# Patient Record
Sex: Female | Born: 1975 | Race: Black or African American | Hispanic: No | Marital: Single | Smoking: Never smoker
Health system: Southern US, Community
[De-identification: ages and names within clinical notes are randomized; demographics above are authoritative.]

## PROBLEM LIST (undated history)

## (undated) DIAGNOSIS — G43909 Migraine, unspecified, not intractable, without status migrainosus: Secondary | ICD-10-CM

## (undated) DIAGNOSIS — N946 Dysmenorrhea, unspecified: Secondary | ICD-10-CM

## (undated) DIAGNOSIS — I2699 Other pulmonary embolism without acute cor pulmonale: Secondary | ICD-10-CM

## (undated) DIAGNOSIS — I10 Essential (primary) hypertension: Secondary | ICD-10-CM

## (undated) DIAGNOSIS — E119 Type 2 diabetes mellitus without complications: Secondary | ICD-10-CM

## (undated) DIAGNOSIS — Z9889 Other specified postprocedural states: Secondary | ICD-10-CM

## (undated) DIAGNOSIS — Z8742 Personal history of other diseases of the female genital tract: Secondary | ICD-10-CM

## (undated) HISTORY — DX: Other specified postprocedural states: Z98.890

## (undated) HISTORY — DX: Other specified postprocedural states: Z87.42

## (undated) HISTORY — PX: CHOLECYSTECTOMY: SHX55

## (undated) HISTORY — DX: Other pulmonary embolism without acute cor pulmonale: I26.99

## (undated) HISTORY — DX: Dysmenorrhea, unspecified: N94.6

## (undated) HISTORY — PX: CERVICAL BIOPSY  W/ LOOP ELECTRODE EXCISION: SUR135

---

## 2018-02-10 ENCOUNTER — Ambulatory Visit
Admission: EM | Admit: 2018-02-10 | Discharge: 2018-02-10 | Disposition: A | Discharge status: Home / Self Care | Payer: Federal, State, Local not specified - PPO | Attending: Family Medicine | Admitting: Family Medicine

## 2018-02-10 ENCOUNTER — Other Ambulatory Visit: Payer: Self-pay

## 2018-02-10 DIAGNOSIS — B356 Tinea cruris: Secondary | ICD-10-CM | POA: Diagnosis not present

## 2018-02-10 DIAGNOSIS — B373 Candidiasis of vulva and vagina: Secondary | ICD-10-CM | POA: Diagnosis not present

## 2018-02-10 DIAGNOSIS — B3731 Acute candidiasis of vulva and vagina: Secondary | ICD-10-CM

## 2018-02-10 DIAGNOSIS — Z113 Encounter for screening for infections with a predominantly sexual mode of transmission: Secondary | ICD-10-CM

## 2018-02-10 HISTORY — DX: Morbid (severe) obesity due to excess calories: E66.01

## 2018-02-10 HISTORY — DX: Essential (primary) hypertension: I10

## 2018-02-10 HISTORY — DX: Type 2 diabetes mellitus without complications: E11.9

## 2018-02-10 HISTORY — DX: Migraine, unspecified, not intractable, without status migrainosus: G43.909

## 2018-02-10 LAB — WET PREP, GENITAL
CLUE CELLS WET PREP: NONE SEEN
Sperm: NONE SEEN
Trich, Wet Prep: NONE SEEN

## 2018-02-10 LAB — URINALYSIS, COMPLETE (UACMP) WITH MICROSCOPIC
BACTERIA UA: NONE SEEN
BILIRUBIN URINE: NEGATIVE
Glucose, UA: 500 mg/dL — AB
Hgb urine dipstick: NEGATIVE
Ketones, ur: NEGATIVE mg/dL
LEUKOCYTES UA: NEGATIVE
Nitrite: NEGATIVE
PH: 5 (ref 5.0–8.0)
Protein, ur: NEGATIVE mg/dL
RBC / HPF: NONE SEEN RBC/hpf (ref 0–5)
SPECIFIC GRAVITY, URINE: 1.01 (ref 1.005–1.030)

## 2018-02-10 LAB — CHLAMYDIA/NGC RT PCR (ARMC ONLY)
CHLAMYDIA TR: NOT DETECTED
N GONORRHOEAE: NOT DETECTED

## 2018-02-10 MED ORDER — FLUCONAZOLE 150 MG PO TABS: 150.0000 mg | ORAL_TABLET | ORAL | 0 refills | Status: DC

## 2018-02-10 MED ORDER — CLOTRIMAZOLE 1 % EX CREA: TOPICAL_CREAM | CUTANEOUS | 1 refills | Status: DC

## 2018-02-10 NOTE — ED Provider Notes (Signed)
[facility]    CSN: [id number] Arrival date & time: 02/10/18  1346  History   Chief Complaint Chief Complaint  Patient presents with  . Skin Problem   HPI  42 year old female presents with a GYN complaint.  Patient reports that since 4/10 she has noticed "flaking" of the skin in her groin and around the labia.  She states it is itchy.  Patient denies vaginal discharge or vaginal odor.  She states she is taking a dose of Diflucan without resolution.  Patient states that she is monogamous.  However, she would like STD testing today as she has not yet seen a GYN in our area.  No other associated symptoms.  No known exacerbating factors.  No other associated symptoms.  No other complaints.  Past Medical History:  Diagnosis Date  . Diabetes mellitus without complication (HCC)   . Hypertension   . Migraines   . Morbid obesity (HCC)     Past Surgical History:  Procedure Laterality Date  . CHOLECYSTECTOMY    . LEEP     OB History   None    Home Medications    Prior to Admission medications   Medication Sig Start Date End Date Taking? Authorizing Provider  Butalbital-APAP-Caffeine 50-325-40 MG capsule Take by mouth. 10/24/17  Yes [provider]  glimepiride (AMARYL) 1 MG tablet Take by mouth. 04/24/17 04/24/18 Yes [provider]  hydrochlorothiazide (HYDRODIURIL) 25 MG tablet Take by mouth. 11/21/17 11/21/18 Yes [provider]  metFORMIN (GLUCOPHAGE-XR) 500 MG 24 hr tablet Take by mouth. 04/24/17 04/24/18 Yes [provider]  clotrimazole (LOTRIMIN) 1 % cream Apply to affected area 2 times daily for up to 4 weeks. 02/10/18   Tommie Sams, DO  fluconazole (DIFLUCAN) 150 MG tablet Take 1 tablet (150 mg total) by mouth once a week. 02/10/18   Tommie Sams, DO  fluticasone (FLONASE) 50 MCG/ACT nasal spray SHAKE LQ AND U 2 SPRAYS IEN Q NIGHT 12/22/17   [provider]    Family History Family History  Problem Relation Age of Onset  .  Hypertension Mother   . Hypertension Father     Social History Social History   Tobacco Use  . Smoking status: Never Smoker  . Smokeless tobacco: Never Used  Substance Use Topics  . Alcohol use: Not Currently  . Drug use: Not Currently     Allergies   Cat hair extract; Pollen extract; Neomycin; and Tretinoin   Review of Systems Review of Systems  Constitutional: Negative.   Genitourinary: Negative for vaginal discharge.       Itching and "flaking of the skin".   Physical Exam Triage Vital Signs ED Triage Vitals  Enc Vitals Group     BP 02/10/18 1415 (!) 150/110     Pulse --      Resp 02/10/18 1415 18     Temp 02/10/18 1415 98.1 F (36.7 C)     Temp Source 02/10/18 1415 Oral     SpO2 02/10/18 1415 99 %     Weight 02/10/18 1416 (!) 443 lb (200.9 kg)     Height --      Head Circumference --      Peak Flow --      Pain Score 02/10/18 1416 4     Pain Loc --      Pain Edu? --      Excl. in GC? --    Updated Vital Signs BP (!) 150/110 (BP Location: Left Arm)  Temp 98.1 F (36.7 C) (Oral)   Resp 18   Wt (!) 443 lb (200.9 kg)   LMP 01/04/2018   SpO2 99%   Physical Exam  Constitutional: She is oriented to person, place, and time. She appears well-developed. No distress.  Cardiovascular: Normal rate and regular rhythm.  Pulmonary/Chest: Effort normal and breath sounds normal. She has no wheezes. She has no rales.  Neurological: She is alert and oriented to person, place, and time.  Psychiatric: She has a normal mood and affect. Her behavior is normal.  Nursing note and vitals reviewed.  UC Treatments / Results  Labs (all labs ordered are listed, but only abnormal results are displayed) Labs Reviewed  WET PREP, GENITAL - Abnormal; Notable for the following components:      Result Value   Yeast Wet Prep HPF POC PRESENT (*)    WBC, Wet Prep HPF POC MANY (*)    All other components within normal limits  URINALYSIS, COMPLETE (UACMP) WITH MICROSCOPIC -  Abnormal; Notable for the following components:   Color, Urine STRAW (*)    Glucose, UA 500 (*)    All other components within normal limits  CHLAMYDIA/NGC RT PCR ([facility] ONLY)  RPR  HIV ANTIBODY (ROUTINE TESTING)   EKG None   Radiology No results found.  Procedures Procedures (including critical care time)  Medications Ordered in UC Medications - No data to display  Initial Impression / Assessment and Plan / UC Course  I have reviewed the triage vital signs and the nursing notes.  Pertinent labs & imaging results that were available during my care of the patient were reviewed by me and considered in my medical decision making (see chart for details).    42 year old female presents with GYN concerns.  Her exam is consistent with tinea cruris and yeast vaginitis.  Treating with Diflucan and clotrimazole.  Patient desired STD screening and these tests were sent.  We will call patient with results.  Patient also requested urinalysis which was remarkable for glucose but was otherwise negative.  Final Clinical Impressions(s) / UC Diagnoses   Final diagnoses:  Tinea cruris  Yeast vaginitis   ED Discharge Orders        Ordered    fluconazole (DIFLUCAN) 150 MG tablet  Weekly     02/10/18 1451    clotrimazole (LOTRIMIN) 1 % cream     02/10/18 1451     Controlled Substance Prescriptions [place] Controlled Substance Registry consulted? Not Applicable   Tommie Sams, DO 02/10/18 1508

## 2018-02-10 NOTE — Discharge Instructions (Signed)
Meds as prescribed. ° °Take care ° °Dr. Tanisia Yokley  °

## 2018-02-10 NOTE — ED Triage Notes (Signed)
Pt with peeling of skin around her vagina x past 10 days. No discharge from vagina or odor. Took a Diflucan x 1 with no improvement. Pt reports the peeling has been spreading to her upper thighs.

## 2018-02-12 NOTE — Progress Notes (Signed)
Results are within normal range. Pt contacted and made aware. Verbalized understanding.   

## 2018-02-13 LAB — RPR: RPR: NONREACTIVE

## 2018-02-13 LAB — HIV ANTIBODY (ROUTINE TESTING W REFLEX): HIV Screen 4th Generation wRfx: NONREACTIVE

## 2018-03-23 ENCOUNTER — Encounter: Payer: Self-pay | Admitting: Certified Nurse Midwife

## 2018-03-23 ENCOUNTER — Encounter: Payer: Self-pay | Admitting: Obstetrics and Gynecology

## 2018-03-23 ENCOUNTER — Ambulatory Visit: Payer: Federal, State, Local not specified - PPO | Admitting: Obstetrics and Gynecology

## 2018-03-23 VITALS — BP 126/82 | HR 90 | Ht 68.25 in | Wt >= 6400 oz

## 2018-03-23 DIAGNOSIS — R300 Dysuria: Secondary | ICD-10-CM

## 2018-03-23 DIAGNOSIS — B373 Candidiasis of vulva and vagina: Secondary | ICD-10-CM

## 2018-03-23 DIAGNOSIS — B3731 Acute candidiasis of vulva and vagina: Secondary | ICD-10-CM

## 2018-03-23 LAB — POCT URINALYSIS DIPSTICK
BILIRUBIN UA: NEGATIVE
GLUCOSE UA: POSITIVE — AB
Leukocytes, UA: NEGATIVE
Nitrite, UA: NEGATIVE
Protein, UA: NEGATIVE
Spec Grav, UA: 1.015 (ref 1.010–1.025)
Urobilinogen, UA: 0.2 E.U./dL
pH, UA: 5 (ref 5.0–8.0)

## 2018-03-23 MED ORDER — TERCONAZOLE 0.4 % VA CREA: 1.0000 | TOPICAL_CREAM | Freq: Every day | VAGINAL | 3 refills | Status: DC

## 2018-03-23 MED ORDER — FLUCONAZOLE 150 MG PO TABS: 150.0000 mg | ORAL_TABLET | Freq: Once | ORAL | 3 refills | Status: AC

## 2018-03-23 NOTE — Progress Notes (Signed)
   Patient ID: Erika Rose, female   DOB: 05/26/1976, 42 y.o.   MRN: [id number]  Reason for Consult: Vaginitis (NP yeast infection)   Referred by Margart SicklesUthe, William, MD  Subjective:     HPI:  Erika Rose is a 42 y.o. female . She reports vaginal itching  Past Medical History:  Diagnosis Date  . Diabetes mellitus without complication (HCC)   . Dysmenorrhea   . History of cervical polypectomy   . Hypertension   . Migraines   . Morbid obesity (HCC)   . Pulmonary embolism (HCC)    secondary to estrogen birth control   Family History  Problem Relation Age of Onset  . Hypertension Mother   . Hypertension Father    Past Surgical History:  Procedure Laterality Date  . CHOLECYSTECTOMY    . LEEP      Short Social History:  Social History   Tobacco Use  . Smoking status: Never Smoker  . Smokeless tobacco: Never Used  Substance Use Topics  . Alcohol use: Not Currently    Allergies  Allergen Reactions  . Cat Hair Extract Itching  . Plum Pulp Other (See Comments)    hypoxia  . Pollen Extract Swelling  . Neomycin Rash and Dermatitis    Rash, redness  . Tretinoin Rash and Dermatitis  . Apple   . Cherry   . Tree Extract     Tree allergies '    Current Outpatient Medications  Medication Sig Dispense Refill  . Butalbital-APAP-Caffeine 50-325-40 MG capsule Take by mouth.    Marland Kitchen. glimepiride (AMARYL) 1 MG tablet Take by mouth.    . hydrochlorothiazide (HYDRODIURIL) 25 MG tablet Take by mouth.    . metFORMIN (GLUCOPHAGE-XR) 500 MG 24 hr tablet Take by mouth.    . Multiple Vitamin (MULTIVITAMIN) tablet Take 1 tablet by mouth daily.    . clotrimazole (LOTRIMIN) 1 % cream Apply to affected area 2 times daily for up to 4 weeks. (Patient not taking: Reported on 03/23/2018) 113 g 1   No current facility-administered medications for this visit.     REVIEW OF SYSTEMS      Objective:  Objective   Vitals:   03/23/18 1443  BP: 126/82  Pulse: 90  Weight: (!) 435 lb (197.3 kg)   Height: 5' 8.25" (1.734 m)   Body mass index is 65.66 kg/m.  Physical Exam     Assessment/Plan:     Yeast vaginitis High dose antifungal Weekly diflucan until resolution  Nuswab sent today  Discussed that patient needs to address her diabetes before she will see a resolution of these symptoms.   Adelene Idlerhristanna Schuman MD [facility], [facility] 03/23/18 3:45 PM

## 2018-03-26 ENCOUNTER — Encounter: Payer: Self-pay | Admitting: Certified Nurse Midwife

## 2018-04-04 LAB — NUSWAB BV AND CANDIDA, NAA
CANDIDA ALBICANS, NAA: POSITIVE — AB
Candida glabrata, NAA: NEGATIVE

## 2018-04-04 NOTE — Progress Notes (Signed)
Please call patient with result or yeast infection and see how she is feeling, she was sent home with prescriptions.  Thank you,  Dr. Jerene PitchSchuman

## 2018-06-19 ENCOUNTER — Encounter: Payer: Self-pay | Admitting: Obstetrics and Gynecology

## 2018-06-19 ENCOUNTER — Other Ambulatory Visit (HOSPITAL_COMMUNITY)
Admission: RE | Admit: 2018-06-19 | Discharge: 2018-06-19 | Disposition: A | Discharge status: Home / Self Care | Payer: Federal, State, Local not specified - PPO | Source: Ambulatory Visit | Attending: Obstetrics and Gynecology | Admitting: Obstetrics and Gynecology

## 2018-06-19 ENCOUNTER — Ambulatory Visit (INDEPENDENT_AMBULATORY_CARE_PROVIDER_SITE_OTHER): Payer: Federal, State, Local not specified - PPO | Admitting: Obstetrics and Gynecology

## 2018-06-19 VITALS — BP 130/84 | HR 86 | Ht 68.0 in | Wt >= 6400 oz

## 2018-06-19 DIAGNOSIS — G43909 Migraine, unspecified, not intractable, without status migrainosus: Secondary | ICD-10-CM | POA: Insufficient documentation

## 2018-06-19 DIAGNOSIS — E119 Type 2 diabetes mellitus without complications: Secondary | ICD-10-CM | POA: Insufficient documentation

## 2018-06-19 DIAGNOSIS — Z01411 Encounter for gynecological examination (general) (routine) with abnormal findings: Secondary | ICD-10-CM

## 2018-06-19 DIAGNOSIS — Z124 Encounter for screening for malignant neoplasm of cervix: Secondary | ICD-10-CM

## 2018-06-19 DIAGNOSIS — Z6841 Body Mass Index (BMI) 40.0 and over, adult: Secondary | ICD-10-CM | POA: Insufficient documentation

## 2018-06-19 DIAGNOSIS — Z113 Encounter for screening for infections with a predominantly sexual mode of transmission: Secondary | ICD-10-CM

## 2018-06-19 DIAGNOSIS — Z1231 Encounter for screening mammogram for malignant neoplasm of breast: Secondary | ICD-10-CM | POA: Diagnosis not present

## 2018-06-19 DIAGNOSIS — I10 Essential (primary) hypertension: Secondary | ICD-10-CM | POA: Insufficient documentation

## 2018-06-19 DIAGNOSIS — Z1239 Encounter for other screening for malignant neoplasm of breast: Secondary | ICD-10-CM

## 2018-06-19 DIAGNOSIS — Z01419 Encounter for gynecological examination (general) (routine) without abnormal findings: Secondary | ICD-10-CM

## 2018-06-19 MED ORDER — MEDROXYPROGESTERONE ACETATE 10 MG PO TABS: 10.0000 mg | ORAL_TABLET | Freq: Every day | ORAL | 2 refills | Status: DC

## 2018-06-19 NOTE — Patient Instructions (Signed)
[facility] [address] [place] [address]  [facility] [place]  [address] [place] [address]  Phone: [phone]  

## 2018-06-19 NOTE — Progress Notes (Signed)
Gynecology Annual Exam  PCP: Chalmers Cater, MD  Chief Complaint:  Chief Complaint  Patient presents with  . Gynecologic Exam    History of Present Illness: Patient is a 42 y.o. G0P0000 presents for annual exam. The patient has no complaints today.   LMP: Patient's last menstrual period was 05/22/2018 (approximate). Average Interval: regular, 30 days Duration of flow: 5 days Heavy Menses: no Clots: no Intermenstrual Bleeding: no Postcoital Bleeding: no Dysmenorrhea: no  The patient has a long standing history of menorrhagia.  Did try Mirena but was not happy with cycle control and discontinued.  Was managed with cyclical provera every 90 days if amenorrhea but has not had to use this.    The patient is sexually active. She currently uses none for contraception. She denies dyspareunia.  The patient does perform self breast exams.  There is no notable family history of breast or ovarian cancer in her family.  The patient wears seatbelts: yes.   The patient has regular exercise: not asked.    The patient denies current symptoms of depression.    Review of Systems: ROS  Past Medical History:  Past Medical History:  Diagnosis Date  . Diabetes mellitus without complication ([place])   . Dysmenorrhea   . History of cervical polypectomy   . Hypertension   . Migraines   . Morbid obesity ([place])   . Pulmonary embolism ([place])    secondary to estrogen birth control    Past Surgical History:  Past Surgical History:  Procedure Laterality Date  . CERVICAL BIOPSY  W/ LOOP ELECTRODE EXCISION    . CHOLECYSTECTOMY      Gynecologic History:  Patient's last menstrual period was 05/22/2018 (approximate). Contraception: none  Ultrasound 09/26/2016 secondary to menorrhagia at [facility] showing 0.7cm heterogenous endometrial stripe, negative for fibroids Endometrial biopsy 08/26/2016 secretory endometrium Pap 08/26/2016 NIL HPV negative  Obstetric History: G0P0000  Family History:  Family  History  Problem Relation Age of Onset  . Hypertension Mother   . Hypertension Father     Social History:  Social History   Socioeconomic History  . Marital status: Single    Spouse name: Not on file  . Number of children: Not on file  . Years of education: Not on file  . Highest education level: Not on file  Occupational History  . Not on file  Social Needs  . Financial resource strain: Not on file  . Food insecurity:    Worry: Not on file    Inability: Not on file  . Transportation needs:    Medical: Not on file    Non-medical: Not on file  Tobacco Use  . Smoking status: Never Smoker  . Smokeless tobacco: Never Used  Substance and Sexual Activity  . Alcohol use: Not Currently  . Drug use: Not Currently  . Sexual activity: Yes    Birth control/protection: None  Lifestyle  . Physical activity:    Days per week: 0 days    Minutes per session: 0 min  . Stress: Only a little  Relationships  . Social connections:    Talks on phone: Not on file    Gets together: Not on file    Attends religious service: Not on file    Active member of club or organization: Not on file    Attends meetings of clubs or organizations: Not on file    Relationship status: Not on file  . Intimate partner violence:    Fear of current or ex  partner: Not on file    Emotionally abused: Not on file    Physically abused: Not on file    Forced sexual activity: Not on file  Other Topics Concern  . Not on file  Social History Narrative  . Not on file    Allergies:  Allergies  Allergen Reactions  . Cat Hair Extract Itching  . Plum Pulp Other (See Comments)    hypoxia  . Pollen Extract Swelling  . Neomycin Rash and Dermatitis    Rash, redness  . Tretinoin Rash and Dermatitis  . Apple   . [place]   . Tree Extract     Tree allergies '    Medications: Prior to Admission medications   Medication Sig Start Date End Date Taking? Authorizing Provider  Blood Glucose Monitoring Suppl  (FIFTY50 GLUCOSE METER 2.0) w/Device KIT Use 1 each as directed 06/19/18  Yes [provider]  Butalbital-APAP-Caffeine 50-325-40 MG capsule Take by mouth. 10/24/17  Yes [provider]  glimepiride (AMARYL) 2 MG tablet Take by mouth. 06/19/18 06/14/19 Yes [provider]  glucose blood (PRECISION QID TEST) test strip Use 1 each (1 strip total) 2 (two) times daily Use as instructed. 06/19/18 06/19/19 Yes [provider]  hydrochlorothiazide (HYDRODIURIL) 25 MG tablet Take by mouth. 11/21/17 11/21/18 Yes [provider]  Lancets Misc. (UNISTIK 2 NORMAL) MISC Use 1 each 2 (two) times daily Use as instructed. 06/19/18 06/19/19 Yes [provider]  Multiple Vitamin (MULTIVITAMIN) tablet Take 1 tablet by mouth daily.   Yes [provider]  clotrimazole (LOTRIMIN) 1 % cream Apply to affected area 2 times daily for up to 4 weeks. Patient not taking: Reported on 03/23/2018 02/10/18   Coral Spikes, DO  metFORMIN (GLUCOPHAGE-XR) 500 MG 24 hr tablet Take by mouth. 04/24/17 04/24/18  [provider]  terconazole (TERAZOL 7) 0.4 % vaginal cream Place 1 applicator vaginally at bedtime. Patient not taking: Reported on 06/19/2018 03/23/18   Homero Fellers, MD    Physical Exam Vitals: Blood pressure 130/84, pulse 86, height _0  (1.727 m), weight (!) 415 lb 14.4 oz (188.7 kg), last menstrual period 05/22/2018. Body mass index is 63.24 kg/m.   General: NAD HEENT: normocephalic, anicteric Thyroid: no enlargement, no palpable nodules Pulmonary: No increased work of breathing, CTAB Cardiovascular: RRR, distal pulses 2+ Breast: Breast symmetrical, no tenderness, no palpable nodules or masses, no skin or nipple retraction present, no nipple discharge.  No axillary or supraclavicular lymphadenopathy. Abdomen: NABS, soft, non-tender, non-distended.  Umbilicus without lesions.  No hepatomegaly, splenomegaly or masses palpable. No evidence of hernia   Genitourinary:  External: Normal external female genitalia.  Normal urethral meatus, normal Bartholin's and Skene's glands.    Vagina: Normal vaginal mucosa, no evidence of prolapse.    Cervix: Grossly normal in appearance, no bleeding  Uterus: Non-enlarged, mobile, normal contour.  No CMT  Adnexa: ovaries non-enlarged, no adnexal masses  Rectal: deferred  Lymphatic: no evidence of inguinal lymphadenopathy Extremities: no edema, erythema, or tenderness Neurologic: Grossly intact Psychiatric: mood appropriate, affect full  Female chaperone present for pelvic and breast  portions of the physical exam    Assessment: 42 y.o. G0P0000 routine annual exam  Plan: Problem List Items Addressed This Visit    None    Visit Diagnoses    Screening for malignant neoplasm of cervix    -  Primary   Relevant Orders   Cytology - PAP   Encounter for gynecological examination without abnormal finding  Breast screening       Relevant Orders   MM 3D SCREEN BREAST BILATERAL   Routine screening for STI (sexually transmitted infection)       Relevant Orders   Cytology - PAP   HEP, RPR, HIV Panel (Completed)   Class 3 severe obesity due to excess calories with serious comorbidity and body mass index (BMI) of 60.0 to 69.9 in adult [facility])       Relevant Medications   glimepiride (AMARYL) 2 MG tablet   Other Relevant Orders   Amb Referral to Bariatric Surgery      1) Mammogram - recommend yearly screening mammogram.  Mammogram Was ordered today   2) STI screening  wasoffered and accepted  3) ASCCP guidelines and rational discussed.  Patient opts for every 3 years screening interval  4) Contraception - the patient is currently using  none. She does inquire about pregnancy odd at her age and given her co-morbidities.  I told her in the setting of regular monthly menses she is likely ovulatory by history.  However her BMI and co-morbidities make her high risk.  I discussed exploring bariatric  surgery option as both a means of potentially aiding with conception, decreasing risk of pregnancy, and well as her long term overall health.    5) Colonoscopy -- discussed obtaining at age 40 given higher incidence of colon cancer in African American patients  6) Routine healthcare maintenance including cholesterol, diabetes screening discussed managed by PCP  7) Return in about 1 year (around 06/20/2019) for annual.   Malachy Mood, MD, [place], [facility] 06/19/2018, 3:19 PM

## 2018-06-20 LAB — HEP, RPR, HIV PANEL
HIV Screen 4th Generation wRfx: NONREACTIVE
Hepatitis B Surface Ag: NEGATIVE
RPR Ser Ql: NONREACTIVE

## 2018-06-22 LAB — CYTOLOGY - PAP
Adequacy: ABSENT
Chlamydia: NEGATIVE
Diagnosis: NEGATIVE
HPV: NOT DETECTED
Neisseria Gonorrhea: NEGATIVE

## 2018-06-23 ENCOUNTER — Other Ambulatory Visit: Payer: Self-pay | Admitting: Obstetrics and Gynecology

## 2018-06-25 NOTE — Telephone Encounter (Signed)
Please advise. Pt requests 90 day supply

## 2018-10-27 ENCOUNTER — Encounter: Payer: Self-pay | Admitting: Emergency Medicine

## 2018-10-27 DIAGNOSIS — E119 Type 2 diabetes mellitus without complications: Secondary | ICD-10-CM | POA: Diagnosis not present

## 2018-10-27 DIAGNOSIS — I10 Essential (primary) hypertension: Secondary | ICD-10-CM | POA: Diagnosis not present

## 2018-10-27 DIAGNOSIS — K8050 Calculus of bile duct without cholangitis or cholecystitis without obstruction: Secondary | ICD-10-CM | POA: Diagnosis not present

## 2018-10-27 DIAGNOSIS — Z79899 Other long term (current) drug therapy: Secondary | ICD-10-CM | POA: Insufficient documentation

## 2018-10-27 DIAGNOSIS — R1010 Upper abdominal pain, unspecified: Secondary | ICD-10-CM | POA: Diagnosis present

## 2018-10-27 LAB — CBC
HCT: 40.9 % (ref 36.0–46.0)
Hemoglobin: 13.5 g/dL (ref 12.0–15.0)
MCH: 28.8 pg (ref 26.0–34.0)
MCHC: 33 g/dL (ref 30.0–36.0)
MCV: 87.2 fL (ref 80.0–100.0)
NRBC: 0 % (ref 0.0–0.2)
PLATELETS: 294 10*3/uL (ref 150–400)
RBC: 4.69 MIL/uL (ref 3.87–5.11)
RDW: 12.7 % (ref 11.5–15.5)
WBC: 7 10*3/uL (ref 4.0–10.5)

## 2018-10-27 LAB — COMPREHENSIVE METABOLIC PANEL
ALBUMIN: 3.9 g/dL (ref 3.5–5.0)
ALK PHOS: 184 U/L — AB (ref 38–126)
ALT: 167 U/L — ABNORMAL HIGH (ref 0–44)
ANION GAP: 9 (ref 5–15)
AST: 342 U/L — ABNORMAL HIGH (ref 15–41)
BILIRUBIN TOTAL: 1.5 mg/dL — AB (ref 0.3–1.2)
BUN: 11 mg/dL (ref 6–20)
CALCIUM: 9.6 mg/dL (ref 8.9–10.3)
CO2: 29 mmol/L (ref 22–32)
Chloride: 99 mmol/L (ref 98–111)
Creatinine, Ser: 0.68 mg/dL (ref 0.44–1.00)
GFR calc non Af Amer: >60 mL/min (ref >60)
Glucose, Bld: 142 mg/dL — ABNORMAL HIGH (ref 70–99)
Potassium: 3.9 mmol/L (ref 3.5–5.1)
Sodium: 137 mmol/L (ref 135–145)
TOTAL PROTEIN: 8.2 g/dL — AB (ref 6.5–8.1)

## 2018-10-27 LAB — URINALYSIS, COMPLETE (UACMP) WITH MICROSCOPIC
Bilirubin Urine: NEGATIVE
GLUCOSE, UA: NEGATIVE mg/dL
HGB URINE DIPSTICK: NEGATIVE
KETONES UR: NEGATIVE mg/dL
LEUKOCYTES UA: NEGATIVE
NITRITE: NEGATIVE
PH: 7 (ref 5.0–8.0)
Protein, ur: NEGATIVE mg/dL
Specific Gravity, Urine: 1.006 (ref 1.005–1.030)

## 2018-10-27 LAB — LIPASE, BLOOD: Lipase: 25 U/L (ref 11–51)

## 2018-10-27 LAB — POCT PREGNANCY, URINE: Preg Test, Ur: NEGATIVE

## 2018-10-27 NOTE — ED Notes (Signed)
Patient refused to have vital signs rechecked. 

## 2018-10-27 NOTE — ED Notes (Signed)
Patient up to stat desk asking about patient relations.  Patient given card with information for patient experience.

## 2018-10-27 NOTE — ED Triage Notes (Signed)
Patient with complaint of generalized abdominal pain and nausea that started about noon today. Patient states that the pain is worse in the left upper quad. Patient states that she had a BM today.

## 2018-10-28 ENCOUNTER — Emergency Department
Admission: EM | Admit: 2018-10-28 | Discharge: 2018-10-28 | Disposition: A | Discharge status: Home / Self Care | Payer: Federal, State, Local not specified - PPO | Attending: Emergency Medicine | Admitting: Emergency Medicine

## 2018-10-28 ENCOUNTER — Emergency Department: Payer: Federal, State, Local not specified - PPO

## 2018-10-28 ENCOUNTER — Encounter: Payer: Self-pay | Admitting: Radiology

## 2018-10-28 DIAGNOSIS — K8050 Calculus of bile duct without cholangitis or cholecystitis without obstruction: Secondary | ICD-10-CM

## 2018-10-28 DIAGNOSIS — R748 Abnormal levels of other serum enzymes: Secondary | ICD-10-CM

## 2018-10-28 DIAGNOSIS — R1010 Upper abdominal pain, unspecified: Secondary | ICD-10-CM

## 2018-10-28 LAB — BILIRUBIN, DIRECT: Bilirubin, Direct: 0.4 mg/dL — ABNORMAL HIGH (ref 0.0–0.2)

## 2018-10-28 MED ORDER — MORPHINE SULFATE (PF) 4 MG/ML IV SOLN
4.0000 mg | Freq: Once | INTRAVENOUS | Status: AC
  Administered 2018-10-28: 4 mg via INTRAVENOUS
  Filled 2018-10-28: qty 1

## 2018-10-28 MED ORDER — SODIUM CHLORIDE 0.9 % IV BOLUS
1000.0000 mL | Freq: Once | INTRAVENOUS | Status: AC
  Administered 2018-10-28: 1000 mL via INTRAVENOUS

## 2018-10-28 MED ORDER — ONDANSETRON HCL 4 MG/2ML IJ SOLN
4.0000 mg | Freq: Once | INTRAMUSCULAR | Status: AC
  Administered 2018-10-28: 4 mg via INTRAVENOUS
  Filled 2018-10-28: qty 2

## 2018-10-28 MED ORDER — TRAMADOL HCL 50 MG PO TABS: 50.0000 mg | ORAL_TABLET | Freq: Four times a day (QID) | ORAL | 0 refills | Status: AC | PRN

## 2018-10-28 MED ORDER — ONDANSETRON 8 MG PO TBDP: 8.0000 mg | ORAL_TABLET | Freq: Three times a day (TID) | ORAL | 0 refills | Status: AC | PRN

## 2018-10-28 MED ORDER — IOPAMIDOL (ISOVUE-370) INJECTION 76%
125.0000 mL | Freq: Once | INTRAVENOUS | Status: AC | PRN
  Administered 2018-10-28: 125 mL via INTRAVENOUS

## 2018-10-28 NOTE — ED Provider Notes (Signed)
[facility] Emergency Department Provider Note ____________________________________________   First MD Initiated Contact with Patient 10/28/18 0024     (approximate)  I have reviewed the triage vital signs and the nursing notes.   HISTORY  Chief Complaint Abdominal Pain    HPI Erika Rose is a 43 y.o. female with PMH as noted below who presents with upper abdominal pain over the last several hours, associated with nausea but no vomiting, and initially in her mid abdomen but then radiating up to her epigastric region and left upper quadrant.  The patient states it feels similar to pain she had before she had her gallbladder taken out.  She denies any change in her bowel movements.  She has no fever or urinary symptoms.  The pain was not associated with eating.  She states she last ate a small amount around 8 AM and the pain started about noon.  Past Medical History:  Diagnosis Date  . Diabetes mellitus without complication ([place])   . Dysmenorrhea   . History of cervical polypectomy   . Hypertension   . Migraines   . Morbid obesity ([place])   . Pulmonary embolism ([place])    secondary to estrogen birth control    There are no active problems to display for this patient.   Past Surgical History:  Procedure Laterality Date  . CERVICAL BIOPSY  W/ LOOP ELECTRODE EXCISION    . CHOLECYSTECTOMY      Prior to Admission medications   Medication Sig Start Date End Date Taking? Authorizing Provider  Blood Glucose Monitoring Suppl (FIFTY50 GLUCOSE METER 2.0) w/Device KIT Use 1 each as directed 06/19/18   [provider]  Butalbital-APAP-Caffeine 50-325-40 MG capsule Take by mouth. 10/24/17   [provider]  clotrimazole (LOTRIMIN) 1 % cream Apply to affected area 2 times daily for up to 4 weeks. Patient not taking: Reported on 03/23/2018 02/10/18   Coral Spikes, DO  glimepiride (AMARYL) 2 MG tablet Take by mouth. 06/19/18 06/14/19  [provider]  glucose blood (PRECISION QID TEST) test strip Use 1 each (1 strip total) 2 (two) times daily Use as instructed. 06/19/18 06/19/19  [provider]  hydrochlorothiazide (HYDRODIURIL) 25 MG tablet Take by mouth. 11/21/17 11/21/18  [provider]  Lancets Misc. (UNISTIK 2 NORMAL) MISC Use 1 each 2 (two) times daily Use as instructed. 06/19/18 06/19/19  [provider]  medroxyPROGESTERone (PROVERA) 10 MG tablet TAKE 1 TABLET BY MOUTH EVERY DAY FOR 10 DAYS 06/26/18   Malachy Mood, MD  metFORMIN (GLUCOPHAGE-XR) 500 MG 24 hr tablet Take by mouth. 04/24/17 04/24/18  [provider]  Multiple Vitamin (MULTIVITAMIN) tablet Take 1 tablet by mouth daily.    [provider]  ondansetron (ZOFRAN ODT) 8 MG disintegrating tablet Take 1 tablet (8 mg total) by mouth every 8 (eight) hours as needed for up to 5 days for nausea or vomiting. 10/28/18 11/02/18  Arta Silence, MD  terconazole (TERAZOL 7) 0.4 % vaginal cream Place 1 applicator vaginally at bedtime. Patient not taking: Reported on 06/19/2018 03/23/18   Homero Fellers, MD  traMADol (ULTRAM) 50 MG tablet Take 1 tablet (50 mg total) by mouth every 6 (six) hours as needed for up to 5 days. 10/28/18 11/02/18  Arta Silence, MD    Allergies Cat hair extract; Plum pulp; Pollen extract; Neomycin; Tretinoin; Apple; Cherry; and Tree extract  Family History  Problem Relation Age of Onset  . Hypertension Mother   . Hypertension  Father     Social History Social History   Tobacco Use  . Smoking status: Never Smoker  . Smokeless tobacco: Never Used  Substance Use Topics  . Alcohol use: Not Currently  . Drug use: Not Currently    Review of Systems  Constitutional: No fever. Eyes: No redness. ENT: No sore throat. Cardiovascular: Denies chest pain. Respiratory: Denies shortness of breath. Gastrointestinal: Positive for nausea. Genitourinary: Negative for dysuria.  Musculoskeletal: Negative for back  pain. Skin: Negative for rash. Neurological: Negative for headache.   ____________________________________________   PHYSICAL EXAM:  VITAL SIGNS: ED Triage Vitals  Enc Vitals Group     BP 10/27/18 1921 (!) 159/98     Pulse Rate 10/27/18 1921 86     Resp 10/27/18 1921 18     Temp 10/27/18 1921 98.4 F (36.9 C)     Temp Source 10/27/18 1921 Oral     SpO2 10/27/18 1921 100 %     Weight 10/27/18 1918 (!) 403 lb (182.8 kg)     Height 10/27/18 1918 5' 8" (1.727 m)     Head Circumference --      Peak Flow --      Pain Score 10/27/18 1918 7     Pain Loc --      Pain Edu? --      Excl. in [place]? --     Constitutional: Alert and oriented.  Slightly uncomfortable appearing but in no acute distress. Eyes: Conjunctivae are normal.  No scleral icterus. Head: Atraumatic. Nose: No congestion/rhinnorhea. Mouth/Throat: Mucous membranes are moist.   Neck: Normal range of motion.  Cardiovascular: Normal rate, regular rhythm. Grossly normal heart sounds.  Good peripheral circulation. Respiratory: Normal respiratory effort.  No retractions. Lungs CTAB. Gastrointestinal: Soft with mild epigastric and bilateral upper quadrant tenderness. No distention.  Genitourinary: No flank tenderness. Musculoskeletal:  Extremities warm and well perfused.  Neurologic:  Normal speech and language. No gross focal neurologic deficits are appreciated.  Skin:  Skin is warm and dry. No rash noted. Psychiatric: Mood and affect are normal. Speech and behavior are normal.  ____________________________________________   LABS (all labs ordered are listed, but only abnormal results are displayed)  Labs Reviewed  COMPREHENSIVE METABOLIC PANEL - Abnormal; Notable for the following components:      Result Value   Glucose, Bld 142 (*)    Total Protein 8.2 (*)    AST 342 (*)    ALT 167 (*)    Alkaline Phosphatase 184 (*)    Total Bilirubin 1.5 (*)    All other components within normal limits  URINALYSIS, COMPLETE  (UACMP) WITH MICROSCOPIC - Abnormal; Notable for the following components:   Color, Urine YELLOW (*)    APPearance CLEAR (*)    Bacteria, UA RARE (*)    All other components within normal limits  BILIRUBIN, DIRECT - Abnormal; Notable for the following components:   Bilirubin, Direct 0.4 (*)    All other components within normal limits  LIPASE, BLOOD  CBC  POCT PREGNANCY, URINE  POC URINE PREG, ED   ____________________________________________  EKG  ED ECG REPORT I, Arta Silence, the attending physician, personally viewed and interpreted this ECG.  Date: 10/28/2018 EKG Time: 1918 Rate: 85 Rhythm: normal sinus rhythm QRS Axis: normal Intervals: normal ST/T Wave abnormalities: normal Narrative Interpretation: no evidence of acute ischemia  ____________________________________________  RADIOLOGY  CT abdomen: No acute abnormality [place] RUQ: No biliary dilatation or other acute abnormality  ____________________________________________   PROCEDURES  Procedure(s)  performed: No  Procedures  Critical Care performed: No ____________________________________________   INITIAL IMPRESSION / ASSESSMENT AND PLAN / ED COURSE  Pertinent labs & imaging results that were available during my care of the patient were reviewed by me and considered in my medical decision making (see chart for details).  43 year old female with PMH as noted above and status post cholecystectomy several years ago presents with abdominal pain, initially in her mid abdomen and now more in the epigastric region and in bilateral upper quadrants.  It is associated with nausea but no vomiting, and did not directly follow eating food.  On exam the patient is somewhat uncomfortable but overall well-appearing.  Her abdomen is soft with some mild epigastric and upper quadrant tenderness.  The remainder of the exam is as described above.  The lab work-up obtained from triage is significant for elevated LFTs,  alk phos, and total bilirubin but the lipase is negative.  Given that the patient is status post cholecystectomy, the most likely causes would be retained stone, choledocholithiasis, cholangitis, or other hepatobiliary etiology, versus gastritis or PUD.  We will obtain CT abdomen, treat pain and nausea, and reassess.  ----------------------------------------- 2:31 AM on 10/28/2018 -----------------------------------------  CT shows no acute abnormalities.  I consulted Dr. Alice Reichert from gastroenterology and discussed the case with him over the phone.  He recommended obtaining an ultrasound to rule out retained bile duct stone and advised that if this was negative the patient would be appropriate for discharge with outpatient follow-up for further evaluation of her elevated LFTs.  ----------------------------------------- 5:52 AM on 10/28/2018 -----------------------------------------  Ultrasound showed no acute abnormalities other than hepatic steatosis.  The patient continues to be comfortable and has not required any further pain medication.  At this time the patient is stable for discharge home.  She feels comfortable going home.  We waited 4 hours after the last morphine since the patient drove herself here.  Based on the recommendations of Dr. Alice Reichert, I will refer her to GI.  I gave the patient thorough return precautions and she expressed understanding.  ____________________________________________   FINAL CLINICAL IMPRESSION(S) / ED DIAGNOSES  Final diagnoses:  Choledocholithiasis  Upper abdominal pain  Elevated liver enzymes      NEW MEDICATIONS STARTED DURING THIS VISIT:  New Prescriptions   ONDANSETRON (ZOFRAN ODT) 8 MG DISINTEGRATING TABLET    Take 1 tablet (8 mg total) by mouth every 8 (eight) hours as needed for up to 5 days for nausea or vomiting.   TRAMADOL (ULTRAM) 50 MG TABLET    Take 1 tablet (50 mg total) by mouth every 6 (six) hours as needed for up to 5 days.      Note:  This document was prepared using Dragon voice recognition software and may include unintentional dictation errors.    Arta Silence, MD 10/28/18 [phone]

## 2018-10-28 NOTE — ED Notes (Signed)
Patient transported to CT 

## 2018-10-28 NOTE — Discharge Instructions (Addendum)
Your liver enzymes are somewhat elevated.  It is very important that you follow-up with a gastroenterologist as soon as possible.  You should call Dr. Norma Fredrickson or another gastroenterologist of your choice on Monday to schedule for an appointment within the next 1 to 2 weeks.  You can take the Zofran as needed for nausea, and the tramadol as needed for pain.  In addition you should start on an acid blocking medication such as Pepcid since her symptoms also may be due to gastritis.  Return to the ER for new, worsening, persistent severe abdominal pain, vomiting, fever, or any other new or worsening symptoms that concern you.

## 2018-11-15 ENCOUNTER — Other Ambulatory Visit: Payer: Self-pay | Admitting: Obstetrics and Gynecology

## 2018-11-15 ENCOUNTER — Ambulatory Visit
Admission: RE | Admit: 2018-11-15 | Discharge: 2018-11-15 | Disposition: A | Discharge status: Home / Self Care | Payer: Federal, State, Local not specified - PPO | Source: Ambulatory Visit | Attending: Obstetrics and Gynecology | Admitting: Obstetrics and Gynecology

## 2018-11-15 DIAGNOSIS — Z1239 Encounter for other screening for malignant neoplasm of breast: Secondary | ICD-10-CM

## 2018-11-15 DIAGNOSIS — R928 Other abnormal and inconclusive findings on diagnostic imaging of breast: Secondary | ICD-10-CM

## 2018-11-15 DIAGNOSIS — N6310 Unspecified lump in the right breast, unspecified quadrant: Secondary | ICD-10-CM

## 2018-12-03 ENCOUNTER — Ambulatory Visit
Admission: RE | Admit: 2018-12-03 | Discharge: 2018-12-03 | Disposition: A | Discharge status: Home / Self Care | Payer: Federal, State, Local not specified - PPO | Source: Ambulatory Visit | Attending: Obstetrics and Gynecology | Admitting: Obstetrics and Gynecology

## 2018-12-03 DIAGNOSIS — R928 Other abnormal and inconclusive findings on diagnostic imaging of breast: Secondary | ICD-10-CM | POA: Diagnosis present

## 2018-12-03 DIAGNOSIS — N6310 Unspecified lump in the right breast, unspecified quadrant: Secondary | ICD-10-CM | POA: Insufficient documentation

## 2019-02-27 ENCOUNTER — Other Ambulatory Visit: Payer: Self-pay | Admitting: Obstetrics and Gynecology

## 2019-02-27 MED ORDER — FLUCONAZOLE 150 MG PO TABS: 150.0000 mg | ORAL_TABLET | Freq: Once | ORAL | 0 refills | Status: DC

## 2019-05-08 ENCOUNTER — Other Ambulatory Visit: Payer: Self-pay

## 2019-05-08 ENCOUNTER — Ambulatory Visit (INDEPENDENT_AMBULATORY_CARE_PROVIDER_SITE_OTHER): Payer: Federal, State, Local not specified - PPO | Admitting: Obstetrics and Gynecology

## 2019-05-08 ENCOUNTER — Encounter: Payer: Self-pay | Admitting: Obstetrics and Gynecology

## 2019-05-08 VITALS — BP 124/89 | HR 89 | Ht 69.0 in

## 2019-05-08 DIAGNOSIS — N760 Acute vaginitis: Secondary | ICD-10-CM

## 2019-05-08 DIAGNOSIS — Z01419 Encounter for gynecological examination (general) (routine) without abnormal findings: Secondary | ICD-10-CM

## 2019-05-08 DIAGNOSIS — Z131 Encounter for screening for diabetes mellitus: Secondary | ICD-10-CM

## 2019-05-08 DIAGNOSIS — Z1239 Encounter for other screening for malignant neoplasm of breast: Secondary | ICD-10-CM

## 2019-05-08 DIAGNOSIS — Z1329 Encounter for screening for other suspected endocrine disorder: Secondary | ICD-10-CM

## 2019-05-08 DIAGNOSIS — Z13220 Encounter for screening for lipoid disorders: Secondary | ICD-10-CM

## 2019-05-08 NOTE — Progress Notes (Signed)
  Gynecology Annual Exam  PCP: Uthe, William, MD  Chief Complaint:  Chief Complaint  Patient presents with  . Gynecologic Exam  . Vaginitis    History of Present Illness: Patient is a 43 y.o. G0P0000 presents for annual exam. The patient has no complaints today.   LMP: Patient's last menstrual period was 05/01/2019 (exact date). Average Interval: regular, 35 days Duration of flow: 6 days Heavy Menses: no Clots: no Intermenstrual Bleeding: no Postcoital Bleeding: no Dysmenorrhea: no   The patient is sexually active. She currently uses none for contraception. She denies dyspareunia.  The patient does perform self breast exams.  There is no notable family history of breast or ovarian cancer in her family.  The patient wears seatbelts: yes.   The patient has regular exercise: not asked.    The patient denies current symptoms of depression.    Review of Systems: Review of Systems  Constitutional: Negative for chills and fever.  HENT: Negative for congestion.   Respiratory: Negative for cough and shortness of breath.   Cardiovascular: Negative for chest pain and palpitations.  Gastrointestinal: Negative for abdominal pain, constipation, diarrhea, heartburn, nausea and vomiting.  Genitourinary: Negative for dysuria, frequency and urgency.  Skin: Negative for itching and rash.  Neurological: Negative for dizziness and headaches.  Endo/Heme/Allergies: Negative for polydipsia.  Psychiatric/Behavioral: Negative for depression.    Past Medical History:  Past Medical History:  Diagnosis Date  . Diabetes mellitus without complication ([facility])   . Dysmenorrhea   . History of cervical polypectomy   . Hypertension   . Migraines   . Morbid obesity ([facility])   . Pulmonary embolism ([facility])    secondary to estrogen birth control    Past Surgical History:  Past Surgical History:  Procedure Laterality Date  . CERVICAL BIOPSY  W/ LOOP ELECTRODE EXCISION    . CHOLECYSTECTOMY       Gynecologic History:  Patient's last menstrual period was 05/01/2019 (exact date). Contraception: none Last Pap: Results were: 06/19/2018 NIL and HR HPV negative  Last mammogram: 12/03/2018 Results were: BI-RAD II  Obstetric History: G0P0000  Family History:  Family History  Problem Relation Age of Onset  . Hypertension Mother   . Hypertension Father   . Breast cancer Neg Hx     Social History:  Social History   Socioeconomic History  . Marital status: Single    Spouse name: Not on file  . Number of children: Not on file  . Years of education: Not on file  . Highest education level: Not on file  Occupational History  . Not on file  Social Needs  . Financial resource strain: Not on file  . Food insecurity    Worry: Not on file    Inability: Not on file  . Transportation needs    Medical: Not on file    Non-medical: Not on file  Tobacco Use  . Smoking status: Never Smoker  . Smokeless tobacco: Never Used  Substance and Sexual Activity  . Alcohol use: Not Currently  . Drug use: Not Currently  . Sexual activity: Not on file  Lifestyle  . Physical activity    Days per week: 0 days    Minutes per session: 0 min  . Stress: Only a little  Relationships  . Social connections    Talks on phone: Not on file    Gets together: Not on file    Attends religious service: Not on file    Active member of club or   organization: Not on file    Attends meetings of clubs or organizations: Not on file    Relationship status: Not on file  . Intimate partner violence    Fear of current or ex partner: Not on file    Emotionally abused: Not on file    Physically abused: Not on file    Forced sexual activity: Not on file  Other Topics Concern  . Not on file  Social History Narrative  . Not on file    Allergies:  Allergies  Allergen Reactions  . Cat Hair Extract Itching  . Plum Pulp Other (See Comments)    hypoxia  . Pollen Extract Swelling  . Neomycin Rash and Dermatitis     Rash, redness  . Tretinoin Rash and Dermatitis  . Apple   . Cherry   . Tree Extract     Tree allergies '    Medications: Prior to Admission medications   Medication Sig Start Date End Date Taking? Authorizing Provider  Blood Glucose Monitoring Suppl (FIFTY50 GLUCOSE METER 2.0) w/Device KIT Use 1 each as directed 06/19/18  Yes [provider]  glimepiride (AMARYL) 2 MG tablet Take by mouth. 06/19/18 06/14/19 Yes [provider]  glucose blood (PRECISION QID TEST) test strip Use 1 each (1 strip total) 2 (two) times daily Use as instructed. 06/19/18 06/19/19 Yes [provider]  Lancets Misc. (UNISTIK 2 NORMAL) MISC Use 1 each 2 (two) times daily Use as instructed. 06/19/18 06/19/19 Yes [provider]  medroxyPROGESTERone (PROVERA) 10 MG tablet TAKE 1 TABLET BY MOUTH EVERY DAY FOR 10 DAYS 06/26/18  Yes Staebler, Andreas, MD  Multiple Vitamin (MULTIVITAMIN) tablet Take 1 tablet by mouth daily.   Yes [provider]  Butalbital-APAP-Caffeine 50-325-40 MG capsule Take by mouth. 10/24/17   [provider]  clotrimazole (LOTRIMIN) 1 % cream Apply to affected area 2 times daily for up to 4 weeks. Patient not taking: Reported on 03/23/2018 02/10/18   Cook, Jayce G, DO  hydrochlorothiazide (HYDRODIURIL) 25 MG tablet Take by mouth. 11/21/17 11/21/18  [provider]  metFORMIN (GLUCOPHAGE-XR) 500 MG 24 hr tablet Take by mouth. 04/24/17 04/24/18  [provider]  terconazole (TERAZOL 7) 0.4 % vaginal cream Place 1 applicator vaginally at bedtime. Patient not taking: Reported on 06/19/2018 03/23/18   Schuman, Christanna R, MD    Physical Exam Vitals: Blood pressure 124/89, pulse 89, height 5' 9" (1.753 m), last menstrual period 05/01/2019. Body mass index is 59.51 kg/m.  General: NAD HEENT: normocephalic, anicteric Thyroid: no enlargement, no palpable nodules Pulmonary: No increased work of breathing, CTAB Cardiovascular: RRR, distal pulses 2+ Breast:  Breast symmetrical, no tenderness, no palpable nodules or masses, no skin or nipple retraction present, no nipple discharge.  No axillary or supraclavicular lymphadenopathy. Abdomen: NABS, soft, non-tender, non-distended.  Umbilicus without lesions.  No hepatomegaly, splenomegaly or masses palpable. No evidence of hernia  Genitourinary:  External: Normal external female genitalia.  Normal urethral meatus, normal Bartholin's and Skene's glands.    Vagina: Normal vaginal mucosa, no evidence of prolapse.    Cervix: Grossly normal in appearance, no bleeding  Uterus: Non-enlarged, mobile, normal contour.  No CMT  Adnexa: ovaries non-enlarged, no adnexal masses  Rectal: deferred  Lymphatic: no evidence of inguinal lymphadenopathy Extremities: no edema, erythema, or tenderness Neurologic: Grossly intact Psychiatric: mood appropriate, affect full  Female chaperone present for pelvic and breast  portions of the physical exam    Assessment: 43 y.o. G0P0000 routine annual exam    Plan: Problem List Items Addressed This Visit    None    Visit Diagnoses    Encounter for gynecological examination without abnormal finding    -  Primary   Relevant Orders   CMP14+LP+TP+TSH+CBC/Plt   Hemoglobin A1c   Breast screening       Screening for diabetes mellitus       Diabetes mellitus screening       Relevant Orders   CMP14+LP+TP+TSH+CBC/Plt   Hemoglobin A1c   Thyroid disorder screening       Relevant Orders   CMP14+LP+TP+TSH+CBC/Plt   Lipid screening       Relevant Orders   CMP14+LP+TP+TSH+CBC/Plt   Recurrent vaginitis       Relevant Orders   NuSwab BV and Candida, NAA      1) Mammogram - recommend yearly screening mammogram.  Mammogram Is up to date   2) STI screening  was notoffered and therefore not obtained  3) ASCCP guidelines and rational discussed.  Patient opts for every 3 years screening interval  4) Contraception - the patient is currently using  none.  She is attempting to  conceive in the near future  - Discussed the low likelihood of spontaneous conception at her age and given her comorbidity.  Discussed evaluation/consultation with reproductive endocrinologist should she wish to get a second opinion.  5) Colonoscopy -- Screening recommended starting at age 45n for African Americans  6) Routine healthcare maintenance including cholesterol, diabetes screening discussed Ordered today  7) Return in about 1 year (around 05/07/2020) for annual.   Andreas Staebler, MD, FACOG [facility], [facility] 05/08/2019, 10:16 AM         

## 2019-05-09 LAB — CMP14+LP+TP+TSH+CBC/PLT
ALT: 18 IU/L (ref 0–32)
AST: 14 IU/L (ref 0–40)
Albumin/Globulin Ratio: 1.3 (ref 1.2–2.2)
Albumin: 3.9 g/dL (ref 3.8–4.8)
Alkaline Phosphatase: 142 IU/L — ABNORMAL HIGH (ref 39–117)
BUN/Creatinine Ratio: 14 (ref 9–23)
BUN: 10 mg/dL (ref 6–24)
Bilirubin Total: 0.5 mg/dL (ref 0.0–1.2)
CO2: 25 mmol/L (ref 20–29)
Calcium: 8.8 mg/dL (ref 8.7–10.2)
Chloride: 97 mmol/L (ref 96–106)
Cholesterol, Total: 162 mg/dL (ref 100–199)
Creatinine, Ser: 0.74 mg/dL (ref 0.57–1.00)
Free Thyroxine Index: 2 (ref 1.2–4.9)
GFR calc Af Amer: 116 mL/min/{1.73_m2} (ref >59)
GFR calc non Af Amer: 101 mL/min/{1.73_m2} (ref >59)
Globulin, Total: 3 g/dL (ref 1.5–4.5)
Glucose: 217 mg/dL — ABNORMAL HIGH (ref 65–99)
HDL: 45 mg/dL (ref >39)
Hematocrit: 42 % (ref 34.0–46.6)
Hemoglobin: 14 g/dL (ref 11.1–15.9)
LDL Calculated: 99 mg/dL (ref 0–99)
LDl/HDL Ratio: 2.2 ratio (ref 0.0–3.2)
MCH: 29.1 pg (ref 26.6–33.0)
MCHC: 33.3 g/dL (ref 31.5–35.7)
MCV: 87 fL (ref 79–97)
Platelets: 306 10*3/uL (ref 150–450)
Potassium: 4 mmol/L (ref 3.5–5.2)
RBC: 4.81 x10E6/uL (ref 3.77–5.28)
RDW: 13.1 % (ref 11.7–15.4)
Sodium: 137 mmol/L (ref 134–144)
T3 Uptake Ratio: 25 % (ref 24–39)
T4, Total: 8 ug/dL (ref 4.5–12.0)
TSH: 2.65 u[IU]/mL (ref 0.450–4.500)
Total Protein: 6.9 g/dL (ref 6.0–8.5)
Triglycerides: 89 mg/dL (ref 0–149)
VLDL Cholesterol Cal: 18 mg/dL (ref 5–40)
WBC: 4.9 10*3/uL (ref 3.4–10.8)

## 2019-05-09 LAB — HEMOGLOBIN A1C
Est. average glucose Bld gHb Est-mCnc: 301 mg/dL
Hgb A1c MFr Bld: 12.1 % — ABNORMAL HIGH (ref 4.8–5.6)

## 2019-05-14 LAB — NUSWAB BV AND CANDIDA, NAA
Candida albicans, NAA: NEGATIVE
Candida glabrata, NAA: NEGATIVE
Megasphaera 1: HIGH Score — AB

## 2019-05-18 ENCOUNTER — Other Ambulatory Visit: Payer: Self-pay | Admitting: Obstetrics and Gynecology

## 2019-05-18 DIAGNOSIS — B3731 Acute candidiasis of vulva and vagina: Secondary | ICD-10-CM

## 2019-05-18 DIAGNOSIS — B373 Candidiasis of vulva and vagina: Secondary | ICD-10-CM

## 2019-05-18 MED ORDER — TERCONAZOLE 0.4 % VA CREA: 1.0000 | TOPICAL_CREAM | Freq: Every day | VAGINAL | 6 refills | Status: DC

## 2019-08-21 ENCOUNTER — Other Ambulatory Visit: Payer: Self-pay | Admitting: Obstetrics and Gynecology

## 2019-08-22 NOTE — Telephone Encounter (Signed)
advise

## 2020-01-06 ENCOUNTER — Other Ambulatory Visit: Payer: Self-pay | Admitting: Obstetrics and Gynecology

## 2020-01-06 DIAGNOSIS — Z1231 Encounter for screening mammogram for malignant neoplasm of breast: Secondary | ICD-10-CM

## 2020-02-03 ENCOUNTER — Ambulatory Visit: Payer: Federal, State, Local not specified - PPO

## 2020-02-03 ENCOUNTER — Other Ambulatory Visit: Payer: Self-pay

## 2020-02-03 ENCOUNTER — Ambulatory Visit
Admission: RE | Admit: 2020-02-03 | Discharge: 2020-02-03 | Disposition: A | Discharge status: Home / Self Care | Payer: Federal, State, Local not specified - PPO | Source: Ambulatory Visit | Attending: Obstetrics and Gynecology | Admitting: Obstetrics and Gynecology

## 2020-02-03 DIAGNOSIS — Z1231 Encounter for screening mammogram for malignant neoplasm of breast: Secondary | ICD-10-CM | POA: Diagnosis not present

## 2020-02-19 ENCOUNTER — Other Ambulatory Visit: Payer: Self-pay

## 2020-02-24 ENCOUNTER — Other Ambulatory Visit: Payer: Self-pay | Admitting: Obstetrics and Gynecology

## 2020-02-24 MED ORDER — FLUCONAZOLE 150 MG PO TABS: ORAL_TABLET | ORAL | 0 refills | Status: DC

## 2020-02-24 NOTE — Telephone Encounter (Signed)
Candida symptoms in setting of poorly controlled DM refilled diflucan

## 2020-07-14 ENCOUNTER — Ambulatory Visit
Admission: EM | Admit: 2020-07-14 | Discharge: 2020-07-14 | Disposition: A | Discharge status: Home / Self Care | Payer: Federal, State, Local not specified - PPO | Attending: Physician Assistant | Admitting: Physician Assistant

## 2020-07-14 ENCOUNTER — Encounter: Payer: Self-pay | Admitting: Emergency Medicine

## 2020-07-14 ENCOUNTER — Other Ambulatory Visit: Payer: Self-pay

## 2020-07-14 DIAGNOSIS — Z113 Encounter for screening for infections with a predominantly sexual mode of transmission: Secondary | ICD-10-CM | POA: Diagnosis not present

## 2020-07-14 DIAGNOSIS — R8290 Unspecified abnormal findings in urine: Secondary | ICD-10-CM | POA: Insufficient documentation

## 2020-07-14 DIAGNOSIS — N760 Acute vaginitis: Secondary | ICD-10-CM | POA: Diagnosis not present

## 2020-07-14 LAB — WET PREP, GENITAL
Sperm: NONE SEEN
Trich, Wet Prep: NONE SEEN
Yeast Wet Prep HPF POC: NONE SEEN

## 2020-07-14 LAB — URINALYSIS, COMPLETE (UACMP) WITH MICROSCOPIC
Bilirubin Urine: NEGATIVE
Glucose, UA: NEGATIVE mg/dL
Hgb urine dipstick: NEGATIVE
Ketones, ur: NEGATIVE mg/dL
Nitrite: NEGATIVE
Protein, ur: NEGATIVE mg/dL
Specific Gravity, Urine: 1.02 (ref 1.005–1.030)
WBC, UA: >50 WBC/hpf (ref 0–5)
pH: 6 (ref 5.0–8.0)

## 2020-07-14 LAB — CHLAMYDIA/NGC RT PCR (ARMC ONLY)
Chlamydia Tr: NOT DETECTED
N gonorrhoeae: NOT DETECTED

## 2020-07-14 MED ORDER — FLUCONAZOLE 150 MG PO TABS: ORAL_TABLET | ORAL | 0 refills | Status: DC

## 2020-07-14 MED ORDER — METRONIDAZOLE 500 MG PO TABS: 500.0000 mg | ORAL_TABLET | Freq: Two times a day (BID) | ORAL | 0 refills | Status: AC

## 2020-07-14 NOTE — ED Provider Notes (Signed)
[facility]    CSN: [id number] Arrival date & time: 07/14/20  1831      History   Chief Complaint Chief Complaint  Patient presents with  . vaginal discomfort    HPI Erika Rose is a 44 y.o. female presenting for vaginal discomfort for over a week.  She denies fever, fatigue, abdominal pain, back pain, vaginal itching, vaginal lesions or rashes.  Patient is sexually active with one partner who is female.  She requests STI testing, but denies any significant concern for STIs.  She denies painful urination, urgency, frequency, hematuria, vaginal discharge.  Patient is diabetic and says it is not well controlled.  Her last A1c was over 9.  No other concerns today.  HPI  Past Medical History:  Diagnosis Date  . Diabetes mellitus without complication ([place])   . Dysmenorrhea   . History of cervical polypectomy   . Hypertension   . Migraines   . Morbid obesity ([place])   . Pulmonary embolism ([place])    secondary to estrogen birth control    There are no problems to display for this patient.   Past Surgical History:  Procedure Laterality Date  . CERVICAL BIOPSY  W/ LOOP ELECTRODE EXCISION    . CHOLECYSTECTOMY      OB History    Gravida  0   Para  0   Term  0   Preterm  0   AB  0   Living  0     SAB  0   TAB  0   Ectopic  0   Multiple  0   Live Births  0            Home Medications    Prior to Admission medications   Medication Sig Start Date End Date Taking? Authorizing Provider  Blood Glucose Monitoring Suppl (FIFTY50 GLUCOSE METER 2.0) w/Device KIT Use 1 each as directed 06/19/18  Yes [provider]  empagliflozin (JARDIANCE) 10 MG TABS tablet Take 1 tablet by mouth daily. 05/18/20 05/18/21 Yes [provider]  glimepiride (AMARYL) 4 MG tablet Take 4 mg by mouth daily. 07/09/20  Yes [provider]  hydrochlorothiazide (HYDRODIURIL) 25 MG tablet Take by mouth. 11/21/17 07/14/20 Yes [provider]  lisinopril  (ZESTRIL) 10 MG tablet Take 10 mg by mouth daily. 05/10/20  Yes [provider]  omeprazole (PRILOSEC) 40 MG capsule Take 40 mg by mouth daily. 05/18/20  Yes [provider]  OZEMPIC, 1 MG/DOSE, 4 MG/3ML SOPN INJECT 1MG UNDER THE SKIN ONCE A WEEK 06/03/20  Yes [provider]  rizatriptan (MAXALT) 10 MG tablet daily as needed. 02/03/20  Yes [provider]  Butalbital-APAP-Caffeine 50-325-40 MG capsule Take by mouth. 10/24/17   [provider]  clotrimazole (LOTRIMIN) 1 % cream Apply to affected area 2 times daily for up to 4 weeks. Patient not taking: Reported on 03/23/2018 02/10/18   Coral Spikes, DO  fluconazole (DIFLUCAN) 150 MG tablet Take 1 tab by mouth every 3 days as needed for yeast infection 07/14/20   Laurene Footman B, PA-C  medroxyPROGESTERone (PROVERA) 10 MG tablet TAKE 1 TABLET BY MOUTH EVERY DAY FOR 10 DAYS 06/26/18   Malachy Mood, MD  metFORMIN (GLUCOPHAGE-XR) 500 MG 24 hr tablet Take by mouth. 04/24/17 04/24/18  [provider]  metroNIDAZOLE (FLAGYL) 500 MG tablet Take 1 tablet (500 mg total) by mouth 2 (two) times daily for 7 days. 07/14/20 07/21/20  Danton Clap, PA-C  Multiple Vitamin (  MULTIVITAMIN) tablet Take 1 tablet by mouth daily.    [provider]  terconazole (TERAZOL 7) 0.4 % vaginal cream Place 1 applicator vaginally at bedtime. 05/18/19   Malachy Mood, MD    Family History Family History  Problem Relation Age of Onset  . Hypertension Mother   . Hypertension Father   . Breast cancer Neg Hx     Social History Social History   Tobacco Use  . Smoking status: Never Smoker  . Smokeless tobacco: Never Used  Vaping Use  . Vaping Use: Never used  Substance Use Topics  . Alcohol use: Not Currently  . Drug use: Not Currently     Allergies   Cat hair extract, Plum pulp, Pollen extract, Neomycin, Tretinoin, Apple, Cherry, Metformin, and Tree extract   Review of Systems Review of Systems    Constitutional: Negative for fatigue and fever.  Respiratory: Negative for cough.   Cardiovascular: Negative for chest pain.  Gastrointestinal: Negative for abdominal pain, diarrhea, nausea and vomiting.  Genitourinary: Negative for dysuria, flank pain, frequency, hematuria, urgency, vaginal bleeding, vaginal discharge and vaginal pain.       Vaginal discomfort  Musculoskeletal: Negative for back pain.  Skin: Negative for rash.     Physical Exam Triage Vital Signs ED Triage Vitals  Enc Vitals Group     BP 07/14/20 1922 (!) 122/94     Pulse Rate 07/14/20 1922 94     Resp 07/14/20 1922 18     Temp 07/14/20 1922 98.9 F (37.2 C)     Temp Source 07/14/20 1922 Oral     SpO2 07/14/20 1922 100 %     Weight 07/14/20 1921 (!) 410 lb (186 kg)     Height 07/14/20 1921 _0  (1.727 m)     Head Circumference --      Peak Flow --      Pain Score 07/14/20 1921 0     Pain Loc --      Pain Edu? --      Excl. in [place]? --    No data found.  Updated Vital Signs BP (!) 122/94 (BP Location: Left Arm) Comment: patient has not taken HTN meds today  Pulse 94   Temp 98.9 F (37.2 C) (Oral)   Resp 18   Ht _1  (1.727 m)   Wt (!) 410 lb (186 kg)   LMP 07/08/2020 (Approximate)   SpO2 100%   BMI 62.34 kg/m       Physical Exam Vitals and nursing note reviewed.  Constitutional:      General: She is not in acute distress.    Appearance: Normal appearance. She is obese. She is not ill-appearing or toxic-appearing.  HENT:     Head: Normocephalic and atraumatic.  Eyes:     General: No scleral icterus.       Right eye: No discharge.        Left eye: No discharge.     Conjunctiva/sclera: Conjunctivae normal.  Cardiovascular:     Rate and Rhythm: Normal rate and regular rhythm.     Heart sounds: Normal heart sounds.  Pulmonary:     Effort: Pulmonary effort is normal. No respiratory distress.     Breath sounds: Normal breath sounds.  Abdominal:     Palpations: Abdomen is soft.      Tenderness: There is no abdominal tenderness. There is no right CVA tenderness or left CVA tenderness.  Musculoskeletal:     Cervical back: Neck supple.  Skin:  General: Skin is dry.  Neurological:     General: No focal deficit present.     Mental Status: She is alert. Mental status is at baseline.     Motor: No weakness.     Gait: Gait normal.  Psychiatric:        Mood and Affect: Mood normal.        Behavior: Behavior normal.        Thought Content: Thought content normal.      UC Treatments / Results  Labs (all labs ordered are listed, but only abnormal results are displayed) Labs Reviewed  WET PREP, GENITAL - Abnormal; Notable for the following components:      Result Value   Clue Cells Wet Prep HPF POC PRESENT (*)    WBC, Wet Prep HPF POC FEW (*)    All other components within normal limits  URINALYSIS, COMPLETE (UACMP) WITH MICROSCOPIC - Abnormal; Notable for the following components:   APPearance HAZY (*)    Leukocytes,Ua MODERATE (*)    Bacteria, UA MANY (*)    All other components within normal limits  CHLAMYDIA/NGC RT PCR ([facility] ONLY)  URINE CULTURE  RPR  HIV ANTIBODY (ROUTINE TESTING W REFLEX)    EKG   Radiology No results found.  Procedures Procedures (including critical care time)  Medications Ordered in UC Medications - No data to display  Initial Impression / Assessment and Plan / UC Course  I have reviewed the triage vital signs and the nursing notes.  Pertinent labs & imaging results that were available during my care of the patient were reviewed by me and considered in my medical decision making (see chart for details).   Wet prep shows clue cells.  Treating at this time for bacterial vaginosis with metronidazole.  Patient wanted to take the oral forms of the gel suppositories.  Also provided patient with prescription for Diflucan as she is prone to yeast infection when she takes antibiotics.  Gonorrhea and Chlamydia testing pending.   Patient also requested syphilis and HIV testing which are pending.  Urinalysis positive for moderate leukocytes.  Urine sent for culture and will treat for UTI if positive.  She does not have any urinary symptoms so holding off on treatment until urine culture returns.  If positive will treat.  Advised patient to follow-up with her clinic as needed for any new or worsening symptoms.   Final Clinical Impressions(s) / UC Diagnoses   Final diagnoses:  Acute vaginitis  Screening examination for sexually transmitted disease  Abnormal urinalysis     Discharge Instructions     You have bacterial vaginosis.  Begin metronidazole antibiotic.  Do not drink alcohol on this medication.  Increase rest and fluid intake.  We have also obtained STI testing.  Do not have intercourse until results return.  If you are positive you will need to be treated and still your partner.  A urinalysis shows possible UTI, but we will send the urine for culture to see if there is bacterial growth.  Since you do not have any urinary symptoms, will wait for treatment unless positive culture or if you have any pain many urinate.  Please call us back if you start to have urinary symptoms and we can send something sooner.  Otherwise, follow-up as needed.    ED Prescriptions    Medication Sig Dispense Auth. Provider   metroNIDAZOLE (FLAGYL) 500 MG tablet Take 1 tablet (500 mg total) by mouth 2 (two) times daily for 7 days.  14 tablet Laurene Footman B, PA-C   fluconazole (DIFLUCAN) 150 MG tablet Take 1 tab by mouth every 3 days as needed for yeast infection 2 tablet Danton Clap, PA-C     PDMP not reviewed this encounter.   Danton Clap, PA-C 07/15/20 1519

## 2020-07-14 NOTE — ED Triage Notes (Signed)
Patient in today c/o vaginal discomfort >1 week. Patient denies vaginal discharge or itching. Patient denies urinary frequency ro dysuria. Patient has appointment with PCP 08/21/20. Patient states that something just doesn't feel right.

## 2020-07-14 NOTE — Discharge Instructions (Addendum)
You have bacterial vaginosis.  Begin metronidazole antibiotic.  Do not drink alcohol on this medication.  Increase rest and fluid intake.  We have also obtained STI testing.  Do not have intercourse until results return.  If you are positive you will need to be treated and still your partner.  A urinalysis shows possible UTI, but we will send the urine for culture to see if there is bacterial growth.  Since you do not have any urinary symptoms, will wait for treatment unless positive culture or if you have any pain many urinate.  Please call us back if you start to have urinary symptoms and we can send something sooner.  Otherwise, follow-up as needed.

## 2020-07-15 LAB — HIV ANTIBODY (ROUTINE TESTING W REFLEX): HIV Screen 4th Generation wRfx: NONREACTIVE

## 2020-07-15 LAB — RPR: RPR Ser Ql: NONREACTIVE

## 2020-07-16 ENCOUNTER — Telehealth (HOSPITAL_COMMUNITY): Payer: Self-pay | Admitting: Emergency Medicine

## 2020-07-16 LAB — URINE CULTURE: Culture: >=100000 — AB

## 2020-07-16 MED ORDER — NITROFURANTOIN MONOHYD MACRO 100 MG PO CAPS: 100.0000 mg | ORAL_CAPSULE | Freq: Two times a day (BID) | ORAL | 0 refills | Status: DC

## 2020-08-28 ENCOUNTER — Other Ambulatory Visit: Payer: Self-pay

## 2020-08-28 ENCOUNTER — Encounter: Payer: Self-pay | Admitting: Obstetrics and Gynecology

## 2020-08-28 ENCOUNTER — Ambulatory Visit: Payer: Federal, State, Local not specified - PPO | Admitting: Obstetrics and Gynecology

## 2020-08-28 VITALS — BP 130/78 | Ht 68.0 in | Wt >= 6400 oz

## 2020-08-28 DIAGNOSIS — N760 Acute vaginitis: Secondary | ICD-10-CM | POA: Diagnosis not present

## 2020-08-28 DIAGNOSIS — Z113 Encounter for screening for infections with a predominantly sexual mode of transmission: Secondary | ICD-10-CM

## 2020-08-28 NOTE — Progress Notes (Signed)
Obstetrics & Gynecology Office Visit   Chief Complaint:  Chief Complaint  Patient presents with  . Vaginal Discharge    History of Present Illness: [name] is a 44 y.o. G0P0000 who LMP was Patient's last menstrual period was 08/14/2020 (approximate)., presents today for a problem visit.   Patient complains of an abnormal vaginal discharge and vaginal irritation for several weeks. Discharge described as: thin. Vaginal symptoms include local irritation.   Other associated symptoms: local irritation.Menstrual pattern: She has achieved amenorrhea on po provera for menorrhagia. Contraception: oral progesterone-only contraceptive.  She denies recent antibiotic exposure, denies changes in soaps, detergents coinciding with the onset of her symptoms.  She has not previously self treated or been under treatment by another provider for these symptoms.   Review of Systems: Review of Systems  Constitutional: Negative.   Gastrointestinal: Negative.   Genitourinary: Negative.     Past Medical History:  Past Medical History:  Diagnosis Date  . Diabetes mellitus without complication ([place])   . Dysmenorrhea   . History of cervical polypectomy   . Hypertension   . Migraines   . Morbid obesity ([place])   . Pulmonary embolism ([place])    secondary to estrogen birth control    Past Surgical History:  Past Surgical History:  Procedure Laterality Date  . CERVICAL BIOPSY  W/ LOOP ELECTRODE EXCISION    . CHOLECYSTECTOMY      Gynecologic History: Patient's last menstrual period was 08/14/2020 (approximate).  Obstetric History: G0P0000  Family History:  Family History  Problem Relation Age of Onset  . Hypertension Mother   . Hypertension Father   . Breast cancer Neg Hx     Social History:  Social History   Socioeconomic History  . Marital status: Single    Spouse name: Not on file  . Number of children: Not on file  . Years of education: Not on file  . Highest education level:  Not on file  Occupational History  . Not on file  Tobacco Use  . Smoking status: Never Smoker  . Smokeless tobacco: Never Used  Vaping Use  . Vaping Use: Never used  Substance and Sexual Activity  . Alcohol use: Not Currently  . Drug use: Not Currently  . Sexual activity: Not on file  Other Topics Concern  . Not on file  Social History Narrative  . Not on file   Social Determinants of Health   Financial Resource Strain:   . Difficulty of Paying Living Expenses: Not on file  Food Insecurity:   . Worried About Charity fundraiser in the Last Year: Not on file  . Ran Out of Food in the Last Year: Not on file  Transportation Needs:   . Lack of Transportation (Medical): Not on file  . Lack of Transportation (Non-Medical): Not on file  Physical Activity:   . Days of Exercise per Week: Not on file  . Minutes of Exercise per Session: Not on file  Stress:   . Feeling of Stress : Not on file  Social Connections:   . Frequency of Communication with Friends and Family: Not on file  . Frequency of Social Gatherings with Friends and Family: Not on file  . Attends Religious Services: Not on file  . Active Member of Clubs or Organizations: Not on file  . Attends Archivist Meetings: Not on file  . Marital Status: Not on file  Intimate Partner Violence:   . Fear of Current or  Ex-Partner: Not on file  . Emotionally Abused: Not on file  . Physically Abused: Not on file  . Sexually Abused: Not on file    Allergies:  Allergies  Allergen Reactions  . Cat Hair Extract Itching  . Plum Pulp Other (See Comments)    hypoxia  . Pollen Extract Swelling  . Neomycin Rash and Dermatitis    Rash, redness  . Tretinoin Rash and Dermatitis  . Apple   . [place]   . Metformin Diarrhea  . Tree Extract     Tree allergies '    Medications: Prior to Admission medications   Medication Sig Start Date End Date Taking? Authorizing Provider  Blood Glucose Monitoring Suppl (FIFTY50  GLUCOSE METER 2.0) w/Device KIT Use 1 each as directed 06/19/18  Yes [provider]  glimepiride (AMARYL) 4 MG tablet Take 4 mg by mouth daily. 07/09/20  Yes [provider]  glimepiride (AMARYL) 4 MG tablet Take by mouth. 08/21/20 08/21/21 Yes [provider]  hydrochlorothiazide (HYDRODIURIL) 25 MG tablet Take by mouth. 02/03/20  Yes [provider]  lisinopril (ZESTRIL) 10 MG tablet Take 10 mg by mouth daily. 05/10/20  Yes [provider]  Multiple Vitamin (MULTIVITAMIN) tablet Take 1 tablet by mouth daily.   Yes [provider]  omeprazole (PRILOSEC) 40 MG capsule Take 40 mg by mouth daily. 05/18/20  Yes [provider]  OZEMPIC, 1 MG/DOSE, 4 MG/3ML SOPN INJECT 1MG UNDER THE SKIN ONCE A WEEK 06/03/20  Yes [provider]  rizatriptan (MAXALT) 10 MG tablet daily as needed. 02/03/20  Yes [provider]  hydrochlorothiazide (HYDRODIURIL) 25 MG tablet Take by mouth. 11/21/17 07/14/20  [provider]  medroxyPROGESTERone (PROVERA) 10 MG tablet TAKE 1 TABLET BY MOUTH EVERY DAY FOR 10 DAYS 06/26/18   Malachy Mood, MD    Physical Exam Vitals:  Vitals:   08/28/20 1124  BP: 130/78   Patient's last menstrual period was 08/14/2020 (approximate).  General: NAD HEENT: normocephalic, anicteric Thyroid: no enlargement, no palpable nodules Pulmonary: No increased work of breathing Cardiovascular: RRR, distal pulses 2+ Abdomen: NABS, soft, non-tender, non-distended.  Umbilicus without lesions.  No hepatomegaly, splenomegaly or masses palpable. No evidence of hernia  Genitourinary:  External: Normal external female genitalia.  Normal urethral meatus, normal  Bartholin's and Skene's glands.    Vagina: Normal vaginal mucosa, no evidence of prolapse.    Cervix: Grossly normal in appearance, no bleeding  Uterus: Non-enlarged, mobile, normal contour.  No CMT  Adnexa: ovaries non-enlarged, no adnexal masses  Rectal:  deferred  Lymphatic: no evidence of inguinal lymphadenopathy Extremities: no edema, erythema, or tenderness Neurologic: Grossly intact Psychiatric: mood appropriate, affect full  Female chaperone present for pelvic  portions of the physical exam  Wet Prep: PH: 5.0 Clue Cells: Negative Fungal elements: Negative Trichomonas: Negative   Assessment: 44 y.o. G0P0000 follow up chronic vaginitis  Plan: Problem List Items Addressed This Visit    None    Visit Diagnoses    Acute vaginitis    -  Primary   Relevant Orders   NuSwab Vaginitis Plus (VG+)   Routine screening for STI (sexually transmitted infection)       Relevant Orders   NuSwab Vaginitis Plus (VG+)     1)  Risk factors for bacterial vaginosis and candida infections discussed.  We discussed normal vaginal flora/microbiome.  Any factors that may alter the microbiome increase the risk of these opportunistic infections.  These include changes in pH, antibiotic exposures, diabetes,  wet bathing suits etc.  We discussed that treatment is aimed at eradicating abnormal bacterial overgrowth and or yeast.  There may be some role for vaginal probiotics in restoring normal vaginal flora.   -Negative wet mount, nuswab sent - If negative for candida will do topical steroid for 4 weeks  2) A total of 15 minutes were spent in face-to-face contact with the patient during this encounter with over half of that time devoted to counseling and coordination of care.  3) Return in about 4 weeks (around 09/25/2020) for medication follow up.    Malachy Mood, MD, [facility], [facility] 08/28/2020, 11:33 AM

## 2020-09-01 LAB — NUSWAB VAGINITIS PLUS (VG+)
Candida albicans, NAA: NEGATIVE
Candida glabrata, NAA: NEGATIVE
Chlamydia trachomatis, NAA: NEGATIVE
Neisseria gonorrhoeae, NAA: NEGATIVE
Trich vag by NAA: NEGATIVE

## 2020-09-04 ENCOUNTER — Other Ambulatory Visit: Payer: Self-pay | Admitting: Obstetrics and Gynecology

## 2020-09-04 MED ORDER — TRIAMCINOLONE ACETONIDE 0.1 % EX CREA: 1.0000 "application " | TOPICAL_CREAM | Freq: Two times a day (BID) | CUTANEOUS | 1 refills | Status: DC

## 2020-09-16 ENCOUNTER — Ambulatory Visit: Payer: Federal, State, Local not specified - PPO | Admitting: Obstetrics and Gynecology

## 2020-09-16 ENCOUNTER — Telehealth: Payer: Self-pay | Admitting: Obstetrics and Gynecology

## 2020-09-16 NOTE — Telephone Encounter (Signed)
Patient is scheduled for 09/18/20 for an follow up. Patient reports she is on her menstrual cycle and wants to know if she can still be seen. Patient believes her PH is off and still having irritation. Please advise if patient needs to be rescheduled.

## 2020-09-16 NOTE — Telephone Encounter (Signed)
Ideally wait for menstruation to stop for vaginitis because I can't check the pH while there is blood present it will throw that off

## 2020-09-16 NOTE — Telephone Encounter (Signed)
Patient is rescheduled for 09/25/20

## 2020-09-18 ENCOUNTER — Ambulatory Visit: Payer: Federal, State, Local not specified - PPO | Admitting: Obstetrics and Gynecology

## 2020-09-25 ENCOUNTER — Ambulatory Visit: Payer: Federal, State, Local not specified - PPO | Admitting: Obstetrics and Gynecology

## 2020-09-28 ENCOUNTER — Encounter: Payer: Self-pay | Admitting: Obstetrics and Gynecology

## 2020-09-28 ENCOUNTER — Ambulatory Visit: Payer: Federal, State, Local not specified - PPO | Admitting: Obstetrics and Gynecology

## 2020-09-28 ENCOUNTER — Other Ambulatory Visit: Payer: Self-pay

## 2020-09-28 VITALS — BP 128/82 | Ht 68.0 in | Wt >= 6400 oz

## 2020-09-28 DIAGNOSIS — N761 Subacute and chronic vaginitis: Secondary | ICD-10-CM | POA: Diagnosis not present

## 2020-09-28 MED ORDER — NYSTATIN 100000 UNIT/GM EX CREA: 1.0000 "application " | TOPICAL_CREAM | Freq: Two times a day (BID) | CUTANEOUS | 1 refills | Status: AC

## 2020-09-28 MED ORDER — TRIAMCINOLONE ACETONIDE 0.1 % EX CREA: 1.0000 "application " | TOPICAL_CREAM | Freq: Two times a day (BID) | CUTANEOUS | 1 refills | Status: AC

## 2020-09-28 NOTE — Progress Notes (Signed)
Obstetrics & Gynecology Office Visit   Chief Complaint:  Chief Complaint  Patient presents with  . Follow-up    History of Present Illness: 44 y.o. G0P0000 presenting for medication follow up for a diagnosis of chronic vaginitis.  She is currently being managed with nothing, has not started triamcinolone.   The patient reports no improvement in symptoms.    She has not noted any side-effects or new symptoms.    The patient reports that she continues to have problems with vaginal irritation as well as discharge.  She also occasional notices odor.  No recent antibiotics exposures.  She has not had any changes is soaps or detergents.    Wet mount negative at last visit, with normal nuswab negative for BV, candida, gonorrhea, chlamydia, and trichomonas.    Review of Systems: Review of Systems  Constitutional: Negative.   Genitourinary: Negative for dysuria, flank pain, frequency, hematuria and urgency.       Vaginal symptoms as detailed above  Skin: Negative.      Past Medical History:  Past Medical History:  Diagnosis Date  . Diabetes mellitus without complication ([place])   . Dysmenorrhea   . History of cervical polypectomy   . Hypertension   . Migraines   . Morbid obesity ([place])   . Pulmonary embolism ([place])    secondary to estrogen birth control    Past Surgical History:  Past Surgical History:  Procedure Laterality Date  . CERVICAL BIOPSY  W/ LOOP ELECTRODE EXCISION    . CHOLECYSTECTOMY      Gynecologic History: No LMP recorded.  Obstetric History: G0P0000  Family History:  Family History  Problem Relation Age of Onset  . Hypertension Mother   . Hypertension Father   . Breast cancer Neg Hx     Social History:  Social History   Socioeconomic History  . Marital status: Single    Spouse name: Not on file  . Number of children: Not on file  . Years of education: Not on file  . Highest education level: Not on file  Occupational History  . Not on file   Tobacco Use  . Smoking status: Never Smoker  . Smokeless tobacco: Never Used  Vaping Use  . Vaping Use: Never used  Substance and Sexual Activity  . Alcohol use: Not Currently  . Drug use: Not Currently  . Sexual activity: Not on file  Other Topics Concern  . Not on file  Social History Narrative  . Not on file   Social Determinants of Health   Financial Resource Strain: Not on file  Food Insecurity: Not on file  Transportation Needs: Not on file  Physical Activity: Not on file  Stress: Not on file  Social Connections: Not on file  Intimate Partner Violence: Not on file    Allergies:  Allergies  Allergen Reactions  . Cat Hair Extract Itching  . Plum Pulp Other (See Comments)    hypoxia  . Pollen Extract Swelling  . Neomycin Rash and Dermatitis    Rash, redness  . Tretinoin Rash and Dermatitis  . Apple   . [place]   . Metformin Diarrhea  . Tree Extract     Tree allergies '    Medications: Prior to Admission medications   Medication Sig Start Date End Date Taking? Authorizing Provider  Blood Glucose Monitoring Suppl (FIFTY50 GLUCOSE METER 2.0) w/Device KIT Use 1 each as directed 06/19/18  Yes [provider]  glimepiride (AMARYL) 4 MG tablet Take  4 mg by mouth daily. 07/09/20  Yes [provider]  glimepiride (AMARYL) 4 MG tablet Take by mouth. 08/21/20 08/21/21 Yes [provider]  hydrochlorothiazide (HYDRODIURIL) 25 MG tablet Take by mouth. 02/03/20  Yes [provider]  lisinopril (ZESTRIL) 10 MG tablet Take 10 mg by mouth daily. 05/10/20  Yes [provider]  medroxyPROGESTERone (PROVERA) 10 MG tablet TAKE 1 TABLET BY MOUTH EVERY DAY FOR 10 DAYS 06/26/18  Yes Malachy Mood, MD  Multiple Vitamin (MULTIVITAMIN) tablet Take 1 tablet by mouth daily.   Yes [provider]  omeprazole (PRILOSEC) 40 MG capsule Take 40 mg by mouth daily. 05/18/20  Yes [provider]  OZEMPIC, 1 MG/DOSE, 4 MG/3ML SOPN INJECT  1MG UNDER THE SKIN ONCE A WEEK 06/03/20  Yes [provider]  rizatriptan (MAXALT) 10 MG tablet daily as needed. 02/03/20  Yes [provider]  hydrochlorothiazide (HYDRODIURIL) 25 MG tablet Take by mouth. 11/21/17 07/14/20  [provider]  nystatin cream (MYCOSTATIN) Apply 1 application topically 2 (two) times daily. 09/28/20   Malachy Mood, MD  triamcinolone (KENALOG) 0.1 % Apply 1 application topically 2 (two) times daily. 09/28/20   Malachy Mood, MD    Physical Exam Vitals:  Vitals:   09/28/20 1549  BP: 128/82   No LMP recorded.  General: NAD, obese, appears stated age 50: normocephalic, anicteric Pulmonary: No increased work of breathing Neurologic: Grossly intact Psychiatric: mood appropriate, affect full  Assessment: 44 y.o. G0P0000 chronic vaginitis  Plan: Problem List Items Addressed This Visit   None   Visit Diagnoses    Chronic vaginitis    -  Primary     1) Vaginitis -  Risk factors for bacterial vaginosis and candida infections discussed.  We discussed normal vaginal flora/microbiome.  Any factors that may alter the microbiome increase the risk of these opportunistic infections.  These include changes in pH, antibiotic exposures, diabetes, wet bathing suits etc.  We discussed that treatment is aimed at eradicating abnormal bacterial overgrowth and or yeast.  There may be some role for vaginal probiotics in restoring normal vaginal flora.     She has not started triamcinolone called in last month.  Discussed negative nuswab results.  She is at increased risk of vaginal candida infection secondary to her history of DMII so will do combination of nytatin/triamcinolone.  We discussed that vaginal pH was normal at the time of last visit but she is concerned that it is off.  Provided with 5 samples of Hylafem which should stabilize vaginal pH as well as ensure healthy population of lactobacilus.   She has be conscientious on cutting out  possible topical allergens.  2) A total of 25 minutes were spent in face-to-face contact with the patient during this encounter with over half of that time devoted to counseling and coordination of care.  3) Return in about 4 weeks (around 10/26/2020) for medication follow up.   Malachy Mood, MD, [facility], [place]

## 2020-11-15 IMAGING — CT CT ABD-PELV W/ CM
2 of 4 series · 17 of 46 positions shown, 19 images · IV contrast (APPLIED)
Comparison: None.

CLINICAL DATA: Generalized abdominal pain and nausea starting at
noon today. Pain worse in the left upper quadrant. Elevated liver
function studies.

EXAM:
CT ABDOMEN AND PELVIS WITH CONTRAST
TECHNIQUE: Multidetector CT imaging of the abdomen and pelvis was performed
using the standard protocol following bolus administration of
intravenous contrast.
CONTRAST:  125mL [id number] IOPAMIDOL ([id number]) INJECTION 76%

[Series 2: routine abd/pel with · axial · 0.86mm/px · z∈[-973,-533]mm · 14 of 98 slices shown, 16 images]
[im 5/98  soft-tissue]
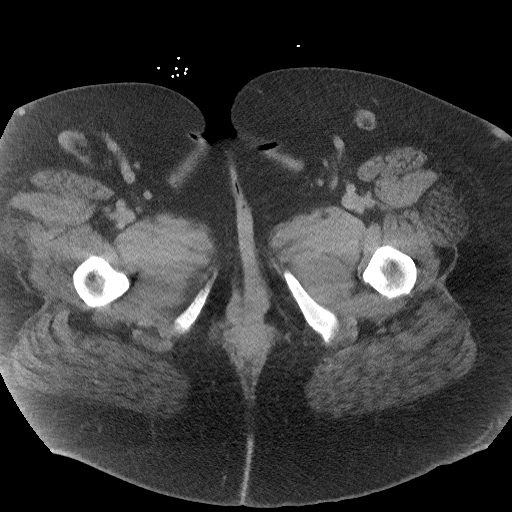
[im 5/98  bone]
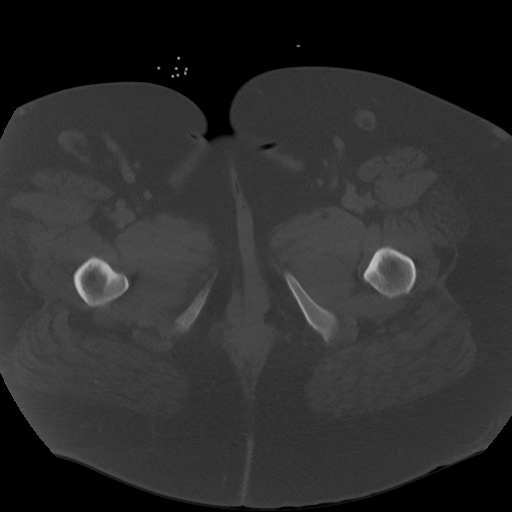
[im 13/98  soft-tissue]
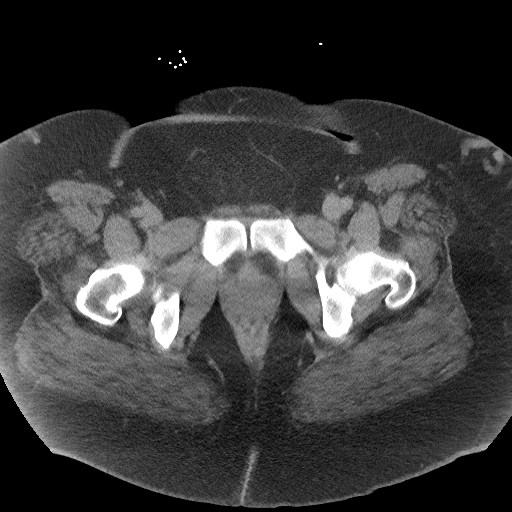
[im 17/98  soft-tissue]
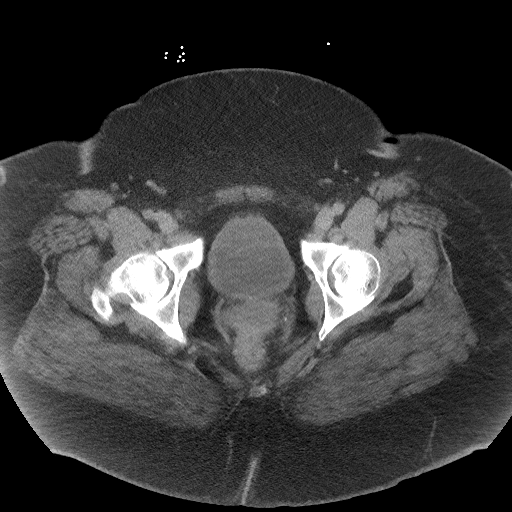
[im 26/98  soft-tissue]
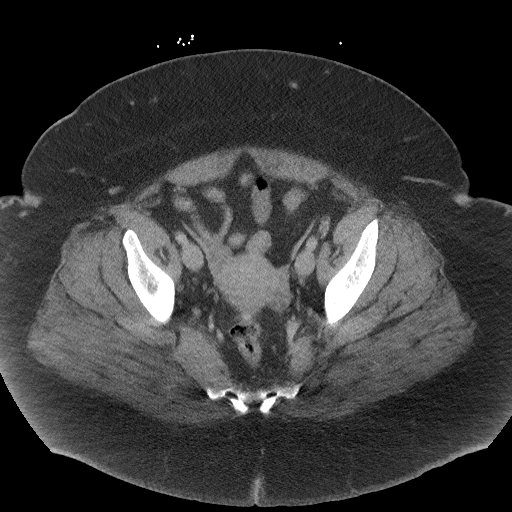
[im 34/98  soft-tissue]
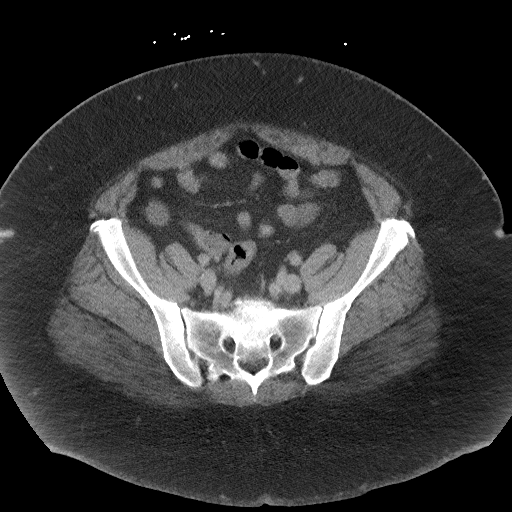
[im 38/98  soft-tissue]
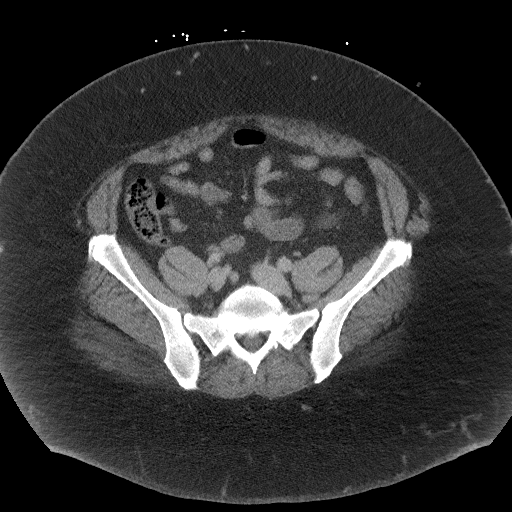
[im 47/98  soft-tissue]
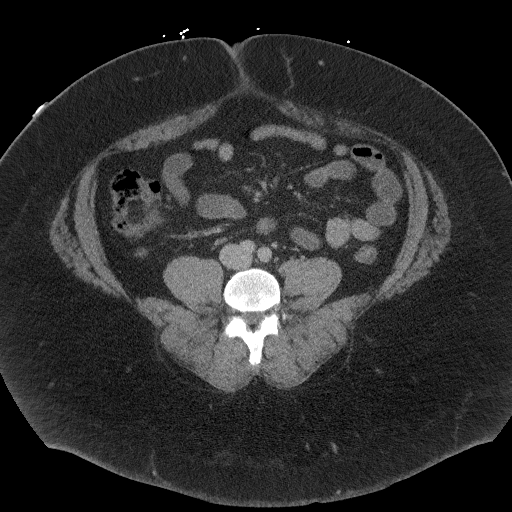
[im 51/98  soft-tissue]
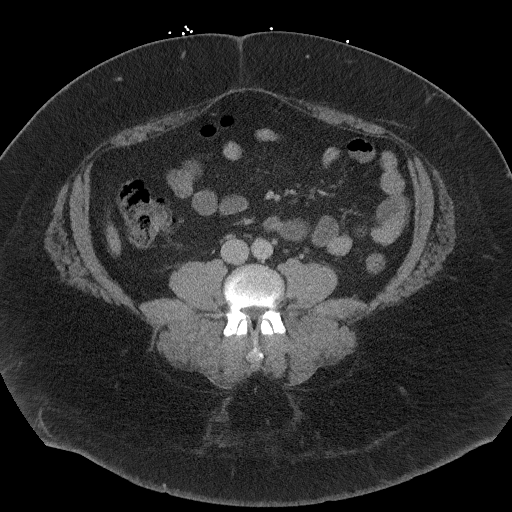
[im 60/98  soft-tissue]
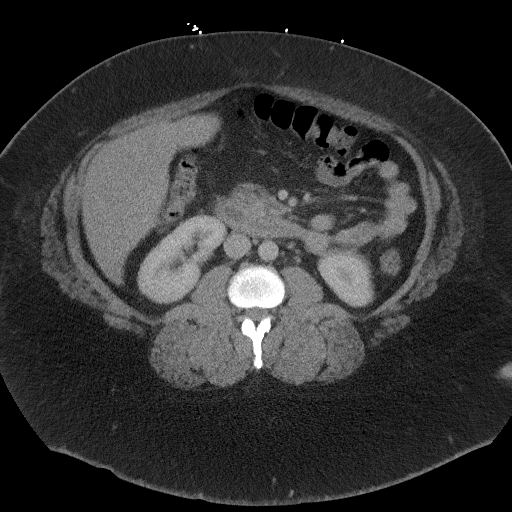
[im 60/98  bone]
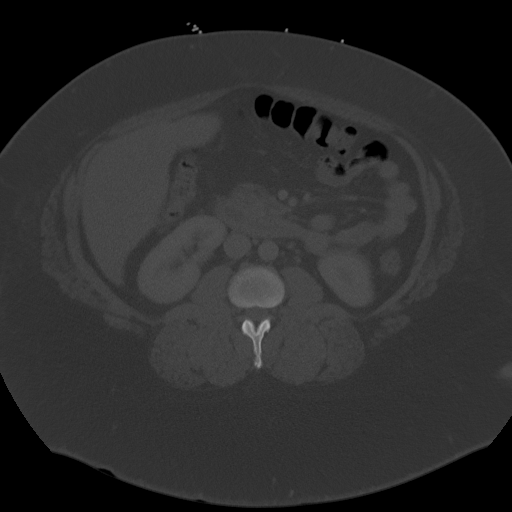
[im 64/98  soft-tissue]
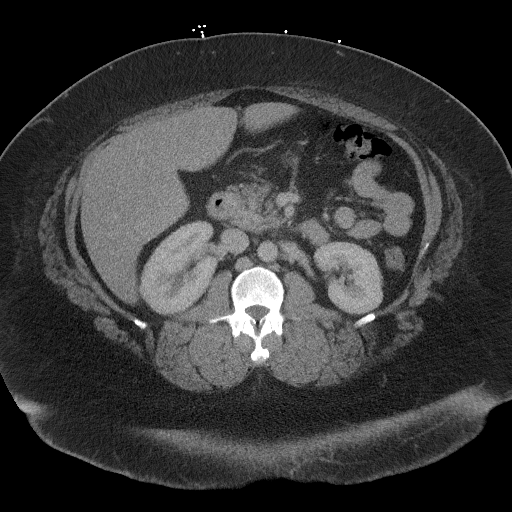
[im 72/98  soft-tissue]
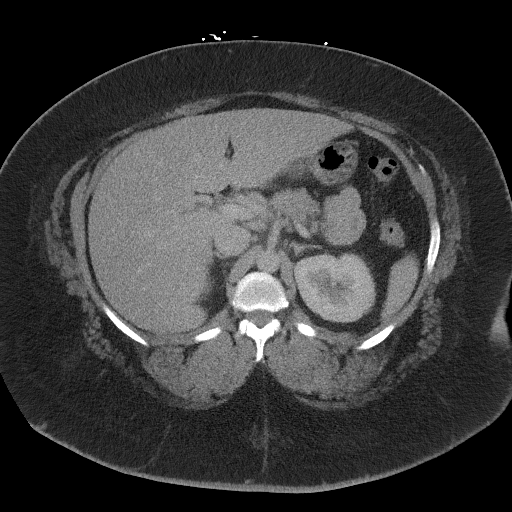
[im 81/98  soft-tissue]
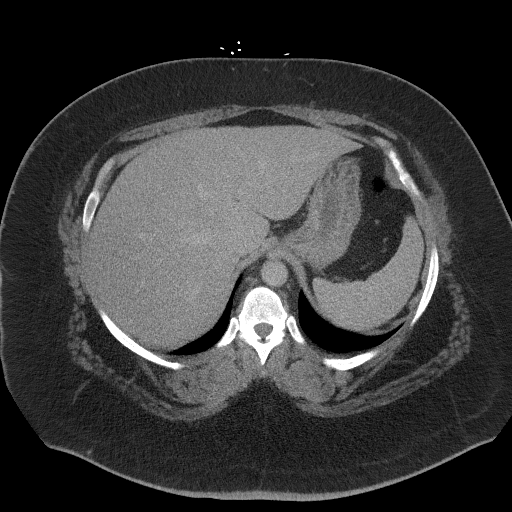
[im 85/98  soft-tissue]
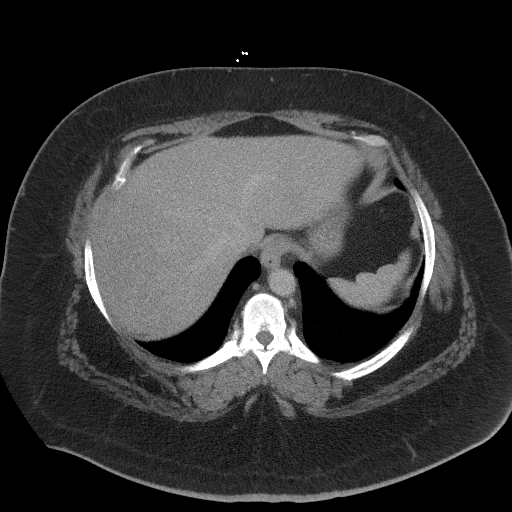
[im 93/98  soft-tissue]
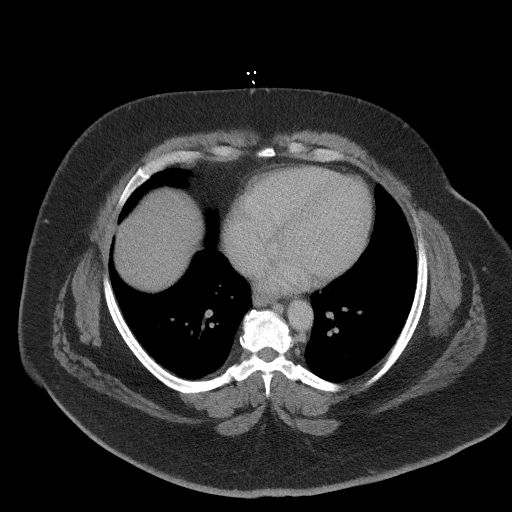

[Series 5: coronal st · coronal · 0.98mm/px · 3 of 127 slices shown]
[im 43/127  soft-tissue]
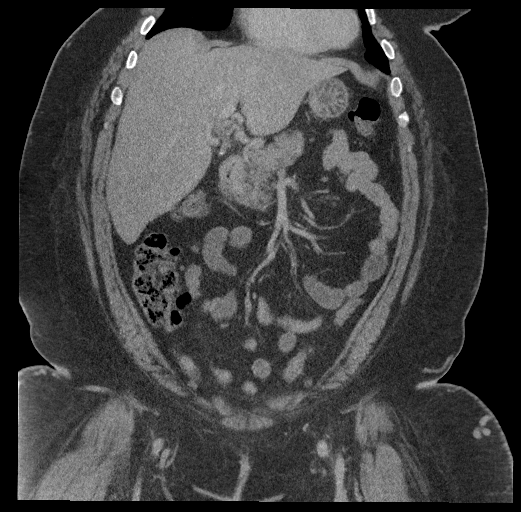
[im 57/127  soft-tissue]
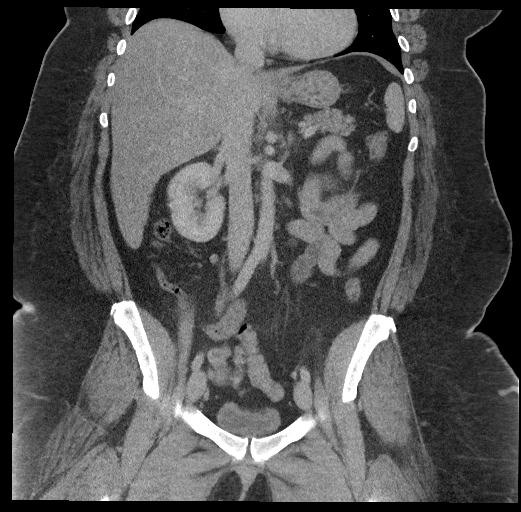
[im 71/127  soft-tissue]
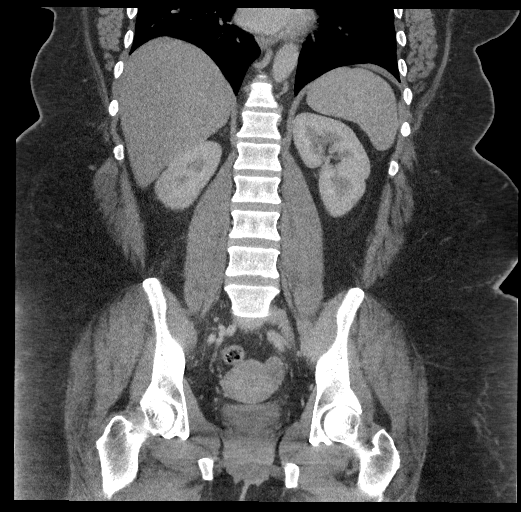

[17 of 46 positions shown; findings below may reference images not displayed]

FINDINGS: Lower chest: Lung bases are clear.

Hepatobiliary: Mild diffuse fatty infiltration of the liver.
Surgical absence of the gallbladder. No focal liver lesions. No bile
duct dilatation.

Pancreas: Unremarkable. No pancreatic ductal dilatation or
surrounding inflammatory changes.

Spleen: Normal in size without focal abnormality.

Adrenals/Urinary Tract: Adrenal glands are unremarkable. Kidneys are
normal, without renal calculi, focal lesion, or hydronephrosis.
Bladder is unremarkable.

Stomach/Bowel: Stomach is within normal limits. Appendix appears
normal. No evidence of bowel wall thickening, distention, or
inflammatory changes.

Vascular/Lymphatic: No significant vascular findings are present. No
enlarged abdominal or pelvic lymph nodes.

Reproductive: Uterus and bilateral adnexa are unremarkable.

Other: No abdominal wall hernia or abnormality. No abdominopelvic
ascites.

Musculoskeletal: No acute or significant osseous findings.
IMPRESSION: No acute process demonstrated in the abdomen or pelvis. No evidence
of bowel obstruction or inflammation. Mild diffuse fatty
infiltration of the liver.

## 2021-01-31 ENCOUNTER — Emergency Department
Admission: EM | Admit: 2021-01-31 | Discharge: 2021-01-31 | Disposition: A | Discharge status: Home / Self Care | Payer: Federal, State, Local not specified - PPO | Attending: Emergency Medicine | Admitting: Emergency Medicine

## 2021-01-31 ENCOUNTER — Other Ambulatory Visit: Payer: Self-pay

## 2021-01-31 DIAGNOSIS — Z7984 Long term (current) use of oral hypoglycemic drugs: Secondary | ICD-10-CM | POA: Diagnosis not present

## 2021-01-31 DIAGNOSIS — R600 Localized edema: Secondary | ICD-10-CM | POA: Diagnosis not present

## 2021-01-31 DIAGNOSIS — I83891 Varicose veins of right lower extremities with other complications: Secondary | ICD-10-CM | POA: Diagnosis not present

## 2021-01-31 DIAGNOSIS — E1165 Type 2 diabetes mellitus with hyperglycemia: Secondary | ICD-10-CM | POA: Diagnosis not present

## 2021-01-31 DIAGNOSIS — Z79899 Other long term (current) drug therapy: Secondary | ICD-10-CM | POA: Insufficient documentation

## 2021-01-31 DIAGNOSIS — Z23 Encounter for immunization: Secondary | ICD-10-CM | POA: Insufficient documentation

## 2021-01-31 DIAGNOSIS — I10 Essential (primary) hypertension: Secondary | ICD-10-CM | POA: Insufficient documentation

## 2021-01-31 LAB — CBC WITH DIFFERENTIAL/PLATELET
Abs Immature Granulocytes: 0.03 10*3/uL (ref 0.00–0.07)
Basophils Absolute: 0 10*3/uL (ref 0.0–0.1)
Basophils Relative: 0 %
Eosinophils Absolute: 0.1 10*3/uL (ref 0.0–0.5)
Eosinophils Relative: 3 %
HCT: 36.5 % (ref 36.0–46.0)
Hemoglobin: 12.2 g/dL (ref 12.0–15.0)
Immature Granulocytes: 1 %
Lymphocytes Relative: 33 %
Lymphs Abs: 1.6 10*3/uL (ref 0.7–4.0)
MCH: 29.3 pg (ref 26.0–34.0)
MCHC: 33.4 g/dL (ref 30.0–36.0)
MCV: 87.5 fL (ref 80.0–100.0)
Monocytes Absolute: 0.3 10*3/uL (ref 0.1–1.0)
Monocytes Relative: 7 %
Neutro Abs: 2.7 10*3/uL (ref 1.7–7.7)
Neutrophils Relative %: 56 %
Platelets: 286 10*3/uL (ref 150–400)
RBC: 4.17 MIL/uL (ref 3.87–5.11)
RDW: 13.3 % (ref 11.5–15.5)
WBC: 4.8 10*3/uL (ref 4.0–10.5)
nRBC: 0 % (ref 0.0–0.2)

## 2021-01-31 LAB — BASIC METABOLIC PANEL
Anion gap: 9 (ref 5–15)
BUN: 12 mg/dL (ref 6–20)
CO2: 28 mmol/L (ref 22–32)
Calcium: 8.7 mg/dL — ABNORMAL LOW (ref 8.9–10.3)
Chloride: 101 mmol/L (ref 98–111)
Creatinine, Ser: 0.75 mg/dL (ref 0.44–1.00)
GFR, Estimated: >60 mL/min (ref >60)
Glucose, Bld: 178 mg/dL — ABNORMAL HIGH (ref 70–99)
Potassium: 3.8 mmol/L (ref 3.5–5.1)
Sodium: 138 mmol/L (ref 135–145)

## 2021-01-31 LAB — PROTIME-INR
INR: 1.1 (ref 0.8–1.2)
Prothrombin Time: 14 seconds (ref 11.4–15.2)

## 2021-01-31 MED ORDER — ACETAMINOPHEN 500 MG PO TABS
1000.0000 mg | ORAL_TABLET | Freq: Once | ORAL | Status: AC
  Administered 2021-01-31: 1000 mg via ORAL
  Filled 2021-01-31: qty 2

## 2021-01-31 MED ORDER — LIDOCAINE-EPINEPHRINE 2 %-1:100000 IJ SOLN
20.0000 mL | Freq: Once | INTRAMUSCULAR | Status: AC
  Administered 2021-01-31: 20 mL via INTRADERMAL
  Filled 2021-01-31: qty 1

## 2021-01-31 MED ORDER — ACETAMINOPHEN 325 MG PO TABS: 650.0000 mg | ORAL_TABLET | Freq: Once | ORAL | Status: DC

## 2021-01-31 MED ORDER — TETANUS-DIPHTH-ACELL PERTUSSIS 5-2.5-18.5 LF-MCG/0.5 IM SUSY
0.5000 mL | PREFILLED_SYRINGE | Freq: Once | INTRAMUSCULAR | Status: AC
  Administered 2021-01-31: 0.5 mL via INTRAMUSCULAR
  Filled 2021-01-31: qty 0.5

## 2021-01-31 NOTE — ED Triage Notes (Signed)
Patient to triage via wheelchair by EMS from home.  Per EMS patient woke, noticed something on her leg and in bathroom realized her leg was bleeding.  EMS report varicose vein ruptured on right lower leg, bleeding now controlled.

## 2021-01-31 NOTE — ED Provider Notes (Signed)
[facility] Emergency Department Provider Note  ____________________________________________   Event Date/Time   First MD Initiated Contact with Patient 01/31/21 [phone]     (approximate)  I have reviewed the triage vital signs and the nursing notes.   HISTORY  Chief Complaint Varicose Vein Bleeding   HPI Erika Rose is a 45 y.o. female with past medical history of diabetes, PE not currently on any anticoagulation, dysmenorrhea and obesity who presents for assessment of bleeding from her right lower leg she noticed this morning.  Does not recall any associated injuries or trauma.  No prior similar episodes.  She states that it bled quite a bit in her living room but seemed to stop when pressure was applied with EMS.  She states she has known varicose veins but has not yet established care with vascular surgeon for repair.  She is not on any blood thinners.  No other recent sick symptoms including fevers, chills, headache, earache, sore throat, vomiting, diarrhea, dysuria, rash or any other acute sick symptoms.  No other acute concerns at this time.         Past Medical History:  Diagnosis Date  . Diabetes mellitus without complication ([employer])   . Dysmenorrhea   . History of cervical polypectomy   . Hypertension   . Migraines   . Morbid obesity ([place])   . Pulmonary embolism ([place])    secondary to estrogen birth control    There are no problems to display for this patient.   Past Surgical History:  Procedure Laterality Date  . CERVICAL BIOPSY  W/ LOOP ELECTRODE EXCISION    . CHOLECYSTECTOMY      Prior to Admission medications   Medication Sig Start Date End Date Taking? Authorizing Provider  Blood Glucose Monitoring Suppl (FIFTY50 GLUCOSE METER 2.0) w/Device KIT Use 1 each as directed 06/19/18   [provider]  glimepiride (AMARYL) 4 MG tablet Take 4 mg by mouth daily. 07/09/20   [provider]  glimepiride (AMARYL) 4 MG tablet Take  by mouth. 08/21/20 08/21/21  [provider]  hydrochlorothiazide (HYDRODIURIL) 25 MG tablet Take by mouth. 11/21/17 07/14/20  [provider]  hydrochlorothiazide (HYDRODIURIL) 25 MG tablet Take by mouth. 02/03/20   [provider]  lisinopril (ZESTRIL) 10 MG tablet Take 10 mg by mouth daily. 05/10/20   [provider]  medroxyPROGESTERone (PROVERA) 10 MG tablet TAKE 1 TABLET BY MOUTH EVERY DAY FOR 10 DAYS 06/26/18   Malachy Mood, MD  Multiple Vitamin (MULTIVITAMIN) tablet Take 1 tablet by mouth daily.    [provider]  nystatin cream (MYCOSTATIN) Apply 1 application topically 2 (two) times daily. 09/28/20   Malachy Mood, MD  omeprazole (PRILOSEC) 40 MG capsule Take 40 mg by mouth daily. 05/18/20   [provider]  OZEMPIC, 1 MG/DOSE, 4 MG/3ML SOPN INJECT 1MG UNDER THE SKIN ONCE A WEEK 06/03/20   [provider]  rizatriptan (MAXALT) 10 MG tablet daily as needed. 02/03/20   [provider]  triamcinolone (KENALOG) 0.1 % Apply 1 application topically 2 (two) times daily. 09/28/20   Malachy Mood, MD    Allergies Cat hair extract, Plum pulp, Pollen extract, Neomycin, Tretinoin, Apple, Cherry, Metformin, and Tree extract  Family History  Problem Relation Age of Onset  . Hypertension Mother   . Hypertension Father   . Breast cancer Neg Hx     Social History Social History   Tobacco Use  . Smoking status: Never Smoker  . Smokeless  tobacco: Never Used  Vaping Use  . Vaping Use: Never used  Substance Use Topics  . Alcohol use: Not Currently  . Drug use: Not Currently    Review of Systems  Review of Systems  Constitutional: Negative for chills and fever.  HENT: Negative for sore throat.   Eyes: Negative for pain.  Respiratory: Negative for cough and stridor.   Cardiovascular: Negative for chest pain.  Gastrointestinal: Negative for vomiting.  Genitourinary: Negative for dysuria.  Musculoskeletal:  Negative for myalgias.  Skin: Negative for rash.  Neurological: Negative for seizures, loss of consciousness and headaches.  Psychiatric/Behavioral: Negative for suicidal ideas.  All other systems reviewed and are negative.     ____________________________________________   PHYSICAL EXAM:  VITAL SIGNS: ED Triage Vitals  Enc Vitals Group     BP 01/31/21 0518 113/85     Pulse Rate 01/31/21 0518 87     Resp 01/31/21 0518 18     Temp 01/31/21 0518 98.2 F (36.8 C)     Temp Source 01/31/21 0518 Oral     SpO2 01/31/21 0518 98 %     Weight 01/31/21 0509 (!) 406 lb (184.2 kg)     Height 01/31/21 0509 5' 8"  (1.727 m)     Head Circumference --      Peak Flow --      Pain Score 01/31/21 0619 4     Pain Loc --      Pain Edu? --      Excl. in [place]? --    Vitals:   01/31/21 0518  BP: 113/85  Pulse: 87  Resp: 18  Temp: 98.2 F (36.8 C)  SpO2: 98%   Physical Exam Vitals and nursing note reviewed.  Constitutional:      General: She is not in acute distress.    Appearance: She is well-developed. She is obese.  HENT:     Head: Normocephalic and atraumatic.     Right Ear: External ear normal.     Left Ear: External ear normal.     Nose: Nose normal.  Eyes:     Conjunctiva/sclera: Conjunctivae normal.  Cardiovascular:     Rate and Rhythm: Normal rate and regular rhythm.     Heart sounds: No murmur heard.   Pulmonary:     Effort: Pulmonary effort is normal. No respiratory distress.  Abdominal:     General: There is no distension.     Palpations: Abdomen is soft.     Tenderness: There is no guarding.  Musculoskeletal:     Cervical back: Neck supple.     Right lower leg: Edema present.     Left lower leg: Edema present.  Skin:    General: Skin is warm and dry.     Capillary Refill: Capillary refill takes less than 2 seconds.  Neurological:     Mental Status: She is alert and oriented to person, place, and time.  Psychiatric:        Mood and Affect: Mood normal.      Patient has small less than 0.5 cm circular lack over her right lower leg just proximal to the ankle that is briskly bleeding on exam after pressure dressing removed.  It is not pulsatile.  2+ DP pulse.  Somewhat more edema in the right ankle and foot compared to the left.  Remainder of right lower extremities unremarkable. ____________________________________________   LABS (all labs ordered are listed, but only abnormal results are displayed)  Labs Reviewed  BASIC METABOLIC PANEL -  Abnormal; Notable for the following components:      Result Value   Glucose, Bld 178 (*)    Calcium 8.7 (*)    All other components within normal limits  CBC WITH DIFFERENTIAL/PLATELET  PROTIME-INR   ____________________________________________  EKG   ____________________________________________  RADIOLOGY  ED MD interpretation:    Official radiology report(s): No results found.  ____________________________________________   PROCEDURES  Procedure(s) performed (including Critical Care):  Marland KitchenMarland KitchenLaceration Repair  Date/Time: 01/31/2021 8:11 AM Performed by: Lucrezia Starch, MD Authorized by: Lucrezia Starch, MD   Consent:    Consent obtained:  Verbal   Consent given by:  Patient   Risks discussed:  Pain, need for additional repair and vascular damage   Alternatives discussed:  No treatment Universal protocol:    Patient identity confirmed:  Verbally with patient Anesthesia:    Anesthesia method:  Topical application and local infiltration   Local anesthetic:  Lidocaine 2% WITH epi Laceration details:    Location:  Leg   Leg location:  R lower leg   Length (cm):  105 Exploration:    Hemostasis achieved with:  Direct pressure   Imaging outcome: foreign body not noted     Wound exploration: wound explored through full range of motion     Contaminated: no   Treatment:    Area cleansed with:  Saline   Amount of cleaning:  Standard   Irrigation method:  Syringe   Debridement:   None   Undermining:  None   Scar revision: no   Approximation:    Approximation:  Close Repair type:    Repair type:  Simple Post-procedure details:    Dressing:  Adhesive bandage   Procedure completion:  Tolerated well, no immediate complications     ____________________________________________   INITIAL IMPRESSION / ASSESSMENT AND PLAN / ED COURSE      Patient presents with above to history exam for assessment of some nontraumatic bleeding from her right lower leg.  On arrival she is afebrile hemodynamically stable.  On exam it seems she has a very small lack over a varicose vein.  She is neurovascular intact distally although has little bit of edema increased compared to the opposite side I suspect this is likely from circumferential pressure dressing applied by EMS.  Hemostasis was achieved with 2 sutures and quick clot applied over these as noted above.  Tetanus updated.  Labs obtained in triage are grossly unremarkable with CBC showing no acute anemia and normal platelets.  INR is within normal limits.  BMP shows glucose of 178 with no other significant derangements.  Low suspicion for acute infectious process, DVT, underlying significant trauma or other immediate life or limb threatening process.  Advise close outpatient vascular follow-up.  Discharged stable condition.  Strict return precautions advised and discussed.      ____________________________________________   FINAL CLINICAL IMPRESSION(S) / ED DIAGNOSES  Final diagnoses:  Bleeding from varicose veins of lower extremity, right    Medications  lidocaine-EPINEPHrine (XYLOCAINE W/EPI) 2 %-1:100000 (with pres) injection 20 mL (has no administration in time range)  Tdap (BOOSTRIX) injection 0.5 mL (has no administration in time range)  acetaminophen (TYLENOL) tablet 1,000 mg (1,000 mg Oral Given 01/31/21 0488)     ED Discharge Orders    None       Note:  This document was prepared using Dragon voice  recognition software and may include unintentional dictation errors.   Lucrezia Starch, MD 01/31/21 [phone]

## 2021-02-11 ENCOUNTER — Encounter (INDEPENDENT_AMBULATORY_CARE_PROVIDER_SITE_OTHER): Payer: Federal, State, Local not specified - PPO

## 2021-02-11 ENCOUNTER — Encounter (INDEPENDENT_AMBULATORY_CARE_PROVIDER_SITE_OTHER): Payer: Federal, State, Local not specified - PPO | Admitting: Vascular Surgery

## 2022-05-23 ENCOUNTER — Ambulatory Visit
Admission: EM | Admit: 2022-05-23 | Discharge: 2022-05-23 | Disposition: A | Discharge status: Home / Self Care | Payer: Federal, State, Local not specified - PPO | Attending: Family Medicine | Admitting: Family Medicine

## 2022-05-23 DIAGNOSIS — A5901 Trichomonal vulvovaginitis: Secondary | ICD-10-CM | POA: Diagnosis not present

## 2022-05-23 DIAGNOSIS — Z711 Person with feared health complaint in whom no diagnosis is made: Secondary | ICD-10-CM | POA: Diagnosis present

## 2022-05-23 DIAGNOSIS — Z113 Encounter for screening for infections with a predominantly sexual mode of transmission: Secondary | ICD-10-CM | POA: Diagnosis not present

## 2022-05-23 LAB — POCT URINALYSIS DIP (MANUAL ENTRY)
Bilirubin, UA: NEGATIVE
Glucose, UA: NEGATIVE mg/dL
Ketones, POC UA: NEGATIVE mg/dL
Leukocytes, UA: NEGATIVE
Nitrite, UA: NEGATIVE
Protein Ur, POC: NEGATIVE mg/dL
Spec Grav, UA: >=1.03 — AB (ref 1.010–1.025)
Urobilinogen, UA: 0.2 E.U./dL
pH, UA: 5 (ref 5.0–8.0)

## 2022-05-23 LAB — POCT URINE PREGNANCY: Preg Test, Ur: NEGATIVE

## 2022-05-23 NOTE — ED Triage Notes (Signed)
Patient presents to Urgent Care with complaints of possible STD. She states she was informed that she may have been exposed to trich. Asymptomatic. Request pregnancy testing, HIV blood work.

## 2022-05-23 NOTE — Discharge Instructions (Addendum)
  Urine negative for infection and urine pregnancy Lab results will be available via My chart 48-72 hours. Refrain from engaging in unprotected sexual intercourse until results are known.

## 2022-05-24 LAB — RPR: RPR Ser Ql: NONREACTIVE

## 2022-05-24 LAB — HIV ANTIBODY (ROUTINE TESTING W REFLEX): HIV Screen 4th Generation wRfx: NONREACTIVE

## 2022-05-24 NOTE — ED Provider Notes (Signed)
[facility]    CSN: [id number] Arrival date & time: 05/23/22  1751      History   Chief Complaint Chief Complaint  Patient presents with   SEXUALLY TRANSMITTED DISEASE    Entered by patient    HPI Erika Rose is a 46 y.o. female.   HPI Patient presents for screening STD testing. She is concerned that partner may have been exposed to an STD, specifically trichomoniasis. She is asymptomatic. Patient's last menstrual period was 04/23/2022.  Past Medical History:  Diagnosis Date   Diabetes mellitus without complication ([place])    Dysmenorrhea    History of cervical polypectomy    Hypertension    Migraines    Morbid obesity ([place])    Pulmonary embolism ([place])    secondary to estrogen birth control    There are no problems to display for this patient.   Past Surgical History:  Procedure Laterality Date   CERVICAL BIOPSY  W/ LOOP ELECTRODE EXCISION     CHOLECYSTECTOMY      OB History     Gravida  0   Para  0   Term  0   Preterm  0   AB  0   Living  0      SAB  0   IAB  0   Ectopic  0   Multiple  0   Live Births  0            Home Medications    Prior to Admission medications   Medication Sig Start Date End Date Taking? Authorizing Provider  Blood Glucose Monitoring Suppl (FIFTY50 GLUCOSE METER 2.0) w/Device KIT Use 1 each as directed 06/19/18   [provider]  glimepiride (AMARYL) 4 MG tablet Take 4 mg by mouth daily. 07/09/20   [provider]  hydrochlorothiazide (HYDRODIURIL) 25 MG tablet Take by mouth. 11/21/17 07/14/20  [provider]  hydrochlorothiazide (HYDRODIURIL) 25 MG tablet Take by mouth. 02/03/20   [provider]  lisinopril (ZESTRIL) 10 MG tablet Take 10 mg by mouth daily. 05/10/20   [provider]  medroxyPROGESTERone (PROVERA) 10 MG tablet TAKE 1 TABLET BY MOUTH EVERY DAY FOR 10 DAYS 06/26/18   Malachy Mood, MD  Multiple Vitamin (MULTIVITAMIN) tablet Take 1 tablet by  mouth daily.    [provider]  nystatin cream (MYCOSTATIN) Apply 1 application topically 2 (two) times daily. 09/28/20   Malachy Mood, MD  omeprazole (PRILOSEC) 40 MG capsule Take 40 mg by mouth daily. 05/18/20   [provider]  OZEMPIC, 1 MG/DOSE, 4 MG/3ML SOPN INJECT 1MG UNDER THE SKIN ONCE A WEEK 06/03/20   [provider]  rizatriptan (MAXALT) 10 MG tablet daily as needed. 02/03/20   [provider]  triamcinolone (KENALOG) 0.1 % Apply 1 application topically 2 (two) times daily. 09/28/20   Malachy Mood, MD    Family History Family History  Problem Relation Age of Onset   Hypertension Mother    Hypertension Father    Breast cancer Neg Hx     Social History Social History   Tobacco Use   Smoking status: Never   Smokeless tobacco: Never  Vaping Use   Vaping Use: Never used  Substance Use Topics   Alcohol use: Not Currently   Drug use: Not Currently     Allergies   Cat hair extract, Plum pulp, Pollen extract, Neomycin, Other, Tretinoin, Apple juice, Cherry, Metformin, Nickel, and Tree extract   Review of Systems Review of Systems  Pertinent negatives listed in HPI  Physical Exam Triage Vital Signs ED Triage Vitals  Enc Vitals Group     BP 05/23/22 1828 116/80     Pulse Rate 05/23/22 1828 89     Resp 05/23/22 1828 18     Temp 05/23/22 1828 97.7 F (36.5 C)     Temp Source 05/23/22 1828 Temporal     SpO2 05/23/22 1828 97 %     Weight --      Height --      Head Circumference --      Peak Flow --      Pain Score 05/23/22 1826 0     Pain Loc --      Pain Edu? --      Excl. in [place]? --    No data found.  Updated Vital Signs BP 116/80 (BP Location: Left Arm)   Pulse 89   Temp 97.7 F (36.5 C) (Temporal)   Resp 18   LMP 04/23/2022   SpO2 97%   Visual Acuity Right Eye Distance:   Left Eye Distance:   Bilateral Distance:    Right Eye Near:   Left Eye Near:    Bilateral Near:     Physical Exam General  appearance: Alert,  cooperative, no distress Head: Normocephalic, without obvious abnormality, atraumatic Heart: Rate and rhythm normal.  Respiratory: Respirations even and unlabored, normal respiratory rate Skin: Skin color, texture, turgor normal. No rashes seen  Neurologic: No focal neurological abnormalities  Psych: Appropriate mood and affect. UC Treatments / Results  Labs (all labs ordered are listed, but only abnormal results are displayed) Labs Reviewed  POCT URINALYSIS DIP (MANUAL ENTRY) - Abnormal; Notable for the following components:      Result Value   Spec Grav, UA >=1.030 (*)    Blood, UA trace-intact (*)    All other components within normal limits  HIV ANTIBODY (ROUTINE TESTING W REFLEX)   Narrative:    Performed at:  [facility] [address] [place], [place]  [id number] Lab Director: Rush Farmer MD, Phone:  [phone]  RPR   Narrative:    Performed at:  [facility] [address] [place], [place]  [id number] Lab Director: Rush Farmer MD, Phone:  [phone]  POCT URINE PREGNANCY  CERVICOVAGINAL ANCILLARY ONLY    EKG   Radiology No results found.  Procedures Procedures (including critical care time)  Medications Ordered in UC Medications - No data to display  Initial Impression / Assessment and Plan / UC Course  I have reviewed the triage vital signs and the nursing notes.  Pertinent labs & imaging results that were available during my care of the patient were reviewed by me and considered in my medical decision making (see chart for details).    Cytology and HIV/RPR pending. Patient advised our clinical staff will only reach out to her if results are abnormal. Refrain from sexual intercourse until results are known.  Final Clinical Impressions(s) / UC Diagnoses   Final diagnoses:  Concern about STD in female without diagnosis     Discharge Instructions       Urine negative for infection and urine  pregnancy Lab results will be available via My chart 48-72 hours. Refrain from engaging in unprotected sexual intercourse until results are known.     ED Prescriptions   None    PDMP not reviewed this encounter.   Scot Jun, FNP 05/24/22 [phone]

## 2022-05-25 ENCOUNTER — Telehealth (HOSPITAL_COMMUNITY): Payer: Self-pay | Admitting: Emergency Medicine

## 2022-05-25 LAB — CERVICOVAGINAL ANCILLARY ONLY
Bacterial Vaginitis (gardnerella): NEGATIVE
Candida Glabrata: NEGATIVE
Candida Vaginitis: NEGATIVE
Chlamydia: NEGATIVE
Comment: NEGATIVE
Comment: NEGATIVE
Comment: NEGATIVE
Comment: NEGATIVE
Comment: NEGATIVE
Comment: NORMAL
Neisseria Gonorrhea: NEGATIVE
Trichomonas: POSITIVE — AB

## 2022-05-25 MED ORDER — METRONIDAZOLE 500 MG PO TABS: 500.0000 mg | ORAL_TABLET | Freq: Two times a day (BID) | ORAL | 0 refills | Status: AC

## 2022-08-15 ENCOUNTER — Encounter (INDEPENDENT_AMBULATORY_CARE_PROVIDER_SITE_OTHER): Payer: Self-pay

## 2023-05-26 ENCOUNTER — Other Ambulatory Visit: Payer: Self-pay

## 2023-05-26 MED ORDER — MOUNJARO 15 MG/0.5ML ~~LOC~~ SOAJ
15.0000 mg | SUBCUTANEOUS | 3 refills | Status: AC
  Filled 2023-05-26: qty 2, 28d supply, fill #0
  Filled 2023-06-22: qty 2, 28d supply, fill #1
  Filled 2023-08-02: qty 2, 28d supply, fill #2

## 2023-06-22 ENCOUNTER — Other Ambulatory Visit: Payer: Self-pay

## 2023-08-02 ENCOUNTER — Other Ambulatory Visit: Payer: Self-pay

## 2023-08-30 ENCOUNTER — Other Ambulatory Visit: Payer: Self-pay | Admitting: Obstetrics

## 2023-08-30 DIAGNOSIS — Z319 Encounter for procreative management, unspecified: Secondary | ICD-10-CM

## 2023-09-08 ENCOUNTER — Ambulatory Visit (HOSPITAL_COMMUNITY): Payer: Federal, State, Local not specified - PPO

## 2023-09-08 ENCOUNTER — Ambulatory Visit
Admission: RE | Admit: 2023-09-08 | Discharge: 2023-09-08 | Disposition: A | Discharge status: Home / Self Care | Payer: Federal, State, Local not specified - PPO | Source: Ambulatory Visit | Attending: Obstetrics | Admitting: Obstetrics

## 2023-09-08 ENCOUNTER — Ambulatory Visit: Payer: Federal, State, Local not specified - PPO

## 2023-09-08 DIAGNOSIS — Z319 Encounter for procreative management, unspecified: Secondary | ICD-10-CM | POA: Insufficient documentation

## 2023-09-28 ENCOUNTER — Other Ambulatory Visit: Payer: Self-pay | Admitting: Obstetrics

## 2023-09-28 DIAGNOSIS — Z319 Encounter for procreative management, unspecified: Secondary | ICD-10-CM

## 2023-10-06 ENCOUNTER — Ambulatory Visit
Admission: RE | Admit: 2023-10-06 | Discharge: 2023-10-06 | Disposition: A | Discharge status: Home / Self Care | Payer: Federal, State, Local not specified - PPO | Source: Ambulatory Visit | Attending: Obstetrics | Admitting: Obstetrics

## 2023-10-06 DIAGNOSIS — Z319 Encounter for procreative management, unspecified: Secondary | ICD-10-CM | POA: Insufficient documentation

## 2023-10-25 ENCOUNTER — Other Ambulatory Visit: Payer: Self-pay | Admitting: Obstetrics

## 2023-10-25 DIAGNOSIS — Z319 Encounter for procreative management, unspecified: Secondary | ICD-10-CM

## 2023-11-03 ENCOUNTER — Ambulatory Visit
Admission: RE | Admit: 2023-11-03 | Discharge: 2023-11-03 | Disposition: A | Discharge status: Home / Self Care | Payer: Federal, State, Local not specified - PPO | Source: Ambulatory Visit | Attending: Obstetrics | Admitting: Obstetrics

## 2023-11-03 DIAGNOSIS — Z319 Encounter for procreative management, unspecified: Secondary | ICD-10-CM | POA: Diagnosis present
